# Patient Record
Sex: Female | Born: 1960 | Race: White | Hispanic: No | Marital: Married | Smoking: Current every day smoker
Health system: Southern US, Community
[De-identification: ages and names within clinical notes are randomized; demographics above are authoritative.]

## PROBLEM LIST (undated history)

## (undated) DIAGNOSIS — E042 Nontoxic multinodular goiter: Secondary | ICD-10-CM

## (undated) DIAGNOSIS — M8580 Other specified disorders of bone density and structure, unspecified site: Secondary | ICD-10-CM

## (undated) DIAGNOSIS — K5792 Diverticulitis of intestine, part unspecified, without perforation or abscess without bleeding: Secondary | ICD-10-CM

## (undated) DIAGNOSIS — Z8051 Family history of malignant neoplasm of kidney: Secondary | ICD-10-CM

## (undated) DIAGNOSIS — C50919 Malignant neoplasm of unspecified site of unspecified female breast: Secondary | ICD-10-CM

## (undated) DIAGNOSIS — Z803 Family history of malignant neoplasm of breast: Secondary | ICD-10-CM

## (undated) DIAGNOSIS — Z8042 Family history of malignant neoplasm of prostate: Secondary | ICD-10-CM

## (undated) DIAGNOSIS — Z800 Family history of malignant neoplasm of digestive organs: Secondary | ICD-10-CM

## (undated) DIAGNOSIS — T7840XA Allergy, unspecified, initial encounter: Secondary | ICD-10-CM

## (undated) DIAGNOSIS — Z8489 Family history of other specified conditions: Secondary | ICD-10-CM

## (undated) HISTORY — PX: BREAST SURGERY: SHX581

## (undated) HISTORY — PX: OTHER SURGICAL HISTORY: SHX169

## (undated) HISTORY — DX: Allergy, unspecified, initial encounter: T78.40XA

## (undated) HISTORY — PX: MASTECTOMY: SHX3

## (undated) HISTORY — PX: DILATION AND CURETTAGE OF UTERUS: SHX78

## (undated) HISTORY — DX: Diverticulitis of intestine, part unspecified, without perforation or abscess without bleeding: K57.92

## (undated) HISTORY — DX: Nontoxic multinodular goiter: E04.2

## (undated) HISTORY — DX: Family history of malignant neoplasm of kidney: Z80.51

## (undated) HISTORY — DX: Family history of malignant neoplasm of prostate: Z80.42

## (undated) HISTORY — DX: Family history of malignant neoplasm of digestive organs: Z80.0

## (undated) HISTORY — PX: ADENOIDECTOMY: SUR15

## (undated) HISTORY — PX: COLONOSCOPY: SHX174

## (undated) HISTORY — PX: BREAST BIOPSY: SHX20

## (undated) HISTORY — DX: Other specified disorders of bone density and structure, unspecified site: M85.80

## (undated) HISTORY — DX: Family history of malignant neoplasm of breast: Z80.3

---

## 1998-07-18 ENCOUNTER — Other Ambulatory Visit: Admission: RE | Admit: 1998-07-18 | Discharge: 1998-07-18 | Payer: Self-pay | Admitting: *Deleted

## 1998-10-13 ENCOUNTER — Encounter: Payer: Self-pay | Admitting: *Deleted

## 1998-10-13 ENCOUNTER — Ambulatory Visit (HOSPITAL_COMMUNITY): Admission: RE | Admit: 1998-10-13 | Discharge: 1998-10-13 | Payer: Self-pay | Admitting: *Deleted

## 1999-10-16 ENCOUNTER — Encounter: Payer: Self-pay | Admitting: *Deleted

## 1999-10-16 ENCOUNTER — Ambulatory Visit (HOSPITAL_COMMUNITY): Admission: RE | Admit: 1999-10-16 | Discharge: 1999-10-16 | Payer: Self-pay | Admitting: *Deleted

## 1999-11-21 ENCOUNTER — Other Ambulatory Visit: Admission: RE | Admit: 1999-11-21 | Discharge: 1999-11-21 | Payer: Self-pay | Admitting: *Deleted

## 2000-10-17 ENCOUNTER — Encounter: Payer: Self-pay | Admitting: *Deleted

## 2000-10-17 ENCOUNTER — Ambulatory Visit (HOSPITAL_COMMUNITY): Admission: RE | Admit: 2000-10-17 | Discharge: 2000-10-17 | Payer: Self-pay | Admitting: *Deleted

## 2001-01-23 ENCOUNTER — Other Ambulatory Visit: Admission: RE | Admit: 2001-01-23 | Discharge: 2001-01-23 | Payer: Self-pay | Admitting: *Deleted

## 2001-10-31 ENCOUNTER — Ambulatory Visit (HOSPITAL_COMMUNITY): Admission: RE | Admit: 2001-10-31 | Discharge: 2001-10-31 | Payer: Self-pay | Admitting: *Deleted

## 2001-10-31 ENCOUNTER — Encounter: Payer: Self-pay | Admitting: *Deleted

## 2002-02-11 ENCOUNTER — Other Ambulatory Visit: Admission: RE | Admit: 2002-02-11 | Discharge: 2002-02-11 | Payer: Self-pay | Admitting: *Deleted

## 2007-04-14 ENCOUNTER — Other Ambulatory Visit: Admission: RE | Admit: 2007-04-14 | Discharge: 2007-04-14 | Payer: Self-pay | Admitting: *Deleted

## 2007-05-16 ENCOUNTER — Ambulatory Visit: Payer: Self-pay | Admitting: Gastroenterology

## 2007-05-16 LAB — CONVERTED CEMR LAB
Basophils Absolute: 0.1 10*3/uL (ref 0.0–0.1)
Basophils Relative: 1 % (ref 0.0–1.0)
Eosinophils Absolute: 0.5 10*3/uL (ref 0.0–0.6)
Eosinophils Relative: 4.7 % (ref 0.0–5.0)
HCT: 41.1 % (ref 36.0–46.0)
Hemoglobin: 14.4 g/dL (ref 12.0–15.0)
Lymphocytes Relative: 27.6 % (ref 12.0–46.0)
MCHC: 35.1 g/dL (ref 30.0–36.0)
MCV: 93 fL (ref 78.0–100.0)
Monocytes Absolute: 0.9 10*3/uL — ABNORMAL HIGH (ref 0.2–0.7)
Monocytes Relative: 9.2 % (ref 3.0–11.0)
Neutro Abs: 5.9 10*3/uL (ref 1.4–7.7)
Neutrophils Relative %: 57.5 % (ref 43.0–77.0)
Platelets: 342 10*3/uL (ref 150–400)
RBC: 4.43 M/uL (ref 3.87–5.11)
RDW: 13.5 % (ref 11.5–14.6)
TSH: 0.4 microintl units/mL (ref 0.35–5.50)
WBC: 10.2 10*3/uL (ref 4.5–10.5)

## 2007-06-23 ENCOUNTER — Ambulatory Visit: Payer: Self-pay | Admitting: Gastroenterology

## 2007-12-15 DIAGNOSIS — E785 Hyperlipidemia, unspecified: Secondary | ICD-10-CM | POA: Insufficient documentation

## 2007-12-15 DIAGNOSIS — Z8719 Personal history of other diseases of the digestive system: Secondary | ICD-10-CM | POA: Insufficient documentation

## 2007-12-15 DIAGNOSIS — N841 Polyp of cervix uteri: Secondary | ICD-10-CM | POA: Insufficient documentation

## 2008-04-29 ENCOUNTER — Ambulatory Visit (HOSPITAL_COMMUNITY): Admission: RE | Admit: 2008-04-29 | Discharge: 2008-04-29 | Payer: Self-pay | Admitting: *Deleted

## 2008-05-17 ENCOUNTER — Other Ambulatory Visit: Admission: RE | Admit: 2008-05-17 | Discharge: 2008-05-17 | Payer: Self-pay | Admitting: Gynecology

## 2009-06-01 ENCOUNTER — Ambulatory Visit (HOSPITAL_COMMUNITY): Admission: RE | Admit: 2009-06-01 | Discharge: 2009-06-01 | Payer: Self-pay | Admitting: Gynecology

## 2011-01-30 NOTE — Assessment & Plan Note (Signed)
[facility]                         GASTROENTEROLOGY OFFICE NOTE   Victoria Richardson, Victoria Richardson                      MRN:          [id number]  DATE:05/16/2007                            DOB:          09-22-1960    NEW PATIENT CONSULTATION:   REASON FOR REFERRAL:  Dr. Randell Patient asked me to evaluate Victoria Richardson in  consultation regarding constipation and intermittent rectal bleeding.   HISTORY OF PRESENT ILLNESS:  Victoria Richardson is a very pleasant 50 year old  woman who has had a change in her bowel habits over the past six months  to a year or so.  She said previously she was moving her bowels once  daily, very easily, without having to strain.  She had no rectal  bleeding.  Things changed six months to a year ago, and over time she is  having to strain at times to move her bowels.  She is also seeing minor  intermittent bright red blood per rectum at the time of bowel movements.  She will sometimes take laxatives she would only move her bowels once  every other day, at most.  She has no abdominal pain.   REVIEW OF SYSTEMS:  Normal for a 10 pound weight gain in the past year.  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   PAST MEDICAL HISTORY:  Elevated cholesterol. Tubal ligation 23 years  ago.  Recent D&C last week for polyps found on her cervix.   CURRENT MEDICATIONS:  None.   ALLERGIES:  No known drug allergies.   SOCIAL HISTORY:  Married.  Two children.  Works as a [employer] fast streamer.  Quit smoking eight months ago.  Nondrinker.   FAMILY HISTORY:  Husband diagnosed with colon cancer.  Mother had colon  polyps.   PHYSICAL EXAMINATION:  Height:  5 feet 4 inches, 135 pounds.  Blood  pressure 108/72, pulse 60.  CONSTITUTIONAL:  Generally well-appearing.  NEUROLOGIC:  Alert and oriented x3.  HEENT:  Eyes:  Extraocular movements intact.  Mouth:  Oropharynx moist.  No lesions.  NECK:  Supple.  No lymphadenopathy.  LUNGS:   Clear to auscultation bilaterally.  CARDIOVASCULAR:  Heart has a regular rate and rhythm.  ABDOMEN:  Soft, nontender, nondistended.  Normal bowel sounds.  EXTREMITIES:  No lower extremity edema.  SKIN:  No rash or lesions on visible extremities.   ASSESSMENT/PLAN:  A 50 year old woman with change in her bowel habits  recently, relatively constipated with intermittent bright red blood per  rectum.   She does not appear to be anemic clinically, but I will have her get a  CBC just to be sure.  She will also get thyroid testing, as hypothyroid  can contribute to constipation.  She has not had a colonoscopy for colon  cancer screening and has a first-degree relative with colon polyps, plus  she is having some rectal bleeding, so these are both definite  indications for colonoscopy.  We will arrange for this to be done at her  soonest convenience.  Lastly, I have recommended her to try fiber  supplements with Citrucel.  She will slowly titrate upward.  That is  usually very effecting in controlling minor constipation such as hers.     Rachael Fee, MD  Electronically Signed    DPJ/MedQ  DD: 05/16/2007  DT: 05/17/2007  Job #: [id number]   cc:   Almedia Balls. Randell Patient, M.D.

## 2013-05-06 ENCOUNTER — Other Ambulatory Visit (HOSPITAL_COMMUNITY): Payer: Self-pay | Admitting: Nurse Practitioner

## 2013-05-06 DIAGNOSIS — Z1231 Encounter for screening mammogram for malignant neoplasm of breast: Secondary | ICD-10-CM

## 2013-06-12 ENCOUNTER — Ambulatory Visit (HOSPITAL_COMMUNITY)
Admission: RE | Admit: 2013-06-12 | Discharge: 2013-06-12 | Disposition: A | Discharge status: Home / Self Care | Payer: BC Managed Care – PPO | Source: Ambulatory Visit | Attending: Nurse Practitioner | Admitting: Nurse Practitioner

## 2013-06-12 DIAGNOSIS — Z1231 Encounter for screening mammogram for malignant neoplasm of breast: Secondary | ICD-10-CM | POA: Insufficient documentation

## 2014-02-10 ENCOUNTER — Ambulatory Visit (INDEPENDENT_AMBULATORY_CARE_PROVIDER_SITE_OTHER): Payer: BC Managed Care – PPO | Admitting: Podiatry

## 2014-02-10 ENCOUNTER — Encounter: Payer: Self-pay | Admitting: Podiatry

## 2014-02-10 ENCOUNTER — Ambulatory Visit (INDEPENDENT_AMBULATORY_CARE_PROVIDER_SITE_OTHER): Payer: BC Managed Care – PPO

## 2014-02-10 VITALS — BP 143/89 | HR 70 | Resp 16

## 2014-02-10 DIAGNOSIS — M779 Enthesopathy, unspecified: Secondary | ICD-10-CM

## 2014-02-10 DIAGNOSIS — L84 Corns and callosities: Secondary | ICD-10-CM

## 2014-02-10 DIAGNOSIS — Q828 Other specified congenital malformations of skin: Secondary | ICD-10-CM

## 2014-02-10 MED ORDER — TRIAMCINOLONE ACETONIDE 10 MG/ML IJ SUSP
10.0000 mg | Freq: Once | INTRAMUSCULAR | Status: AC
Start: 2014-02-10 — End: 2014-02-10
  Administered 2014-02-10: 10 mg

## 2014-02-10 NOTE — Progress Notes (Signed)
Subjective:     Patient ID: Victoria Richardson, female   DOB: 12-Jul-1961, 53 y.o.   MRN: [id number]  HPI patient states I have these awful lesions underneath my foot with my right one being much worse than the left and making it hard for me to walk. States it's been going on for almost a year   Review of Systems  All other systems reviewed and are negative.      Objective:   Physical Exam  Nursing note and vitals reviewed. Constitutional: She is oriented to person, place, and time.  Cardiovascular: Intact distal pulses.   Musculoskeletal: Normal range of motion.  Neurological: She is oriented to person, place, and time.  Skin: Skin is warm.   neurovascular status intact with range of motion adequate and muscle strength within normal limits. 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 Reappoint when symptomatic began and consider orthotics or possible osteotomy

## 2014-02-10 NOTE — Progress Notes (Signed)
   Subjective:    Patient ID: Victoria Richardson, female    DOB: 10/07/1960, 53 y.o.   MRN: [id number]  HPI Comments: "I have this wart or something"  Patient c/o tender callused area sub 2nd and 5th MPJ left and sub 5th MPJ right for several months. The right foot is worse. She has been trimming these areas-no help. Walking a lot makes worse.     Review of Systems  All other systems reviewed and are negative.      Objective:   Physical Exam        Assessment & Plan:

## 2014-05-05 ENCOUNTER — Ambulatory Visit: Payer: BC Managed Care – PPO | Admitting: Podiatry

## 2014-12-06 ENCOUNTER — Other Ambulatory Visit (HOSPITAL_COMMUNITY): Payer: Self-pay | Admitting: Nurse Practitioner

## 2014-12-06 DIAGNOSIS — Z1231 Encounter for screening mammogram for malignant neoplasm of breast: Secondary | ICD-10-CM

## 2014-12-28 ENCOUNTER — Ambulatory Visit (HOSPITAL_COMMUNITY)
Admission: RE | Admit: 2014-12-28 | Discharge: 2014-12-28 | Disposition: A | Discharge status: Home / Self Care | Payer: BLUE CROSS/BLUE SHIELD | Source: Ambulatory Visit | Attending: Nurse Practitioner | Admitting: Nurse Practitioner

## 2014-12-28 DIAGNOSIS — Z1231 Encounter for screening mammogram for malignant neoplasm of breast: Secondary | ICD-10-CM | POA: Insufficient documentation

## 2014-12-30 ENCOUNTER — Other Ambulatory Visit: Payer: Self-pay | Admitting: Nurse Practitioner

## 2014-12-30 DIAGNOSIS — R928 Other abnormal and inconclusive findings on diagnostic imaging of breast: Secondary | ICD-10-CM

## 2015-01-03 ENCOUNTER — Other Ambulatory Visit: Payer: Self-pay | Admitting: Nurse Practitioner

## 2015-01-03 DIAGNOSIS — R928 Other abnormal and inconclusive findings on diagnostic imaging of breast: Secondary | ICD-10-CM

## 2015-01-04 ENCOUNTER — Ambulatory Visit
Admission: RE | Admit: 2015-01-04 | Discharge: 2015-01-04 | Disposition: A | Discharge status: Home / Self Care | Payer: BLUE CROSS/BLUE SHIELD | Source: Ambulatory Visit | Attending: Nurse Practitioner | Admitting: Nurse Practitioner

## 2015-01-04 DIAGNOSIS — R928 Other abnormal and inconclusive findings on diagnostic imaging of breast: Secondary | ICD-10-CM

## 2016-01-30 ENCOUNTER — Encounter: Payer: Self-pay | Admitting: Physician Assistant

## 2016-01-30 ENCOUNTER — Ambulatory Visit (INDEPENDENT_AMBULATORY_CARE_PROVIDER_SITE_OTHER): Payer: BLUE CROSS/BLUE SHIELD | Admitting: Physician Assistant

## 2016-01-30 VITALS — BP 118/74 | HR 60 | Temp 98.2°F | Resp 18 | Ht 63.0 in | Wt 127.0 lb

## 2016-01-30 DIAGNOSIS — Z23 Encounter for immunization: Secondary | ICD-10-CM | POA: Diagnosis not present

## 2016-01-30 DIAGNOSIS — Z720 Tobacco use: Secondary | ICD-10-CM | POA: Diagnosis not present

## 2016-01-30 DIAGNOSIS — Z0000 Encounter for general adult medical examination without abnormal findings: Secondary | ICD-10-CM | POA: Diagnosis not present

## 2016-01-30 DIAGNOSIS — Z716 Tobacco abuse counseling: Secondary | ICD-10-CM | POA: Diagnosis not present

## 2016-01-30 DIAGNOSIS — F17200 Nicotine dependence, unspecified, uncomplicated: Secondary | ICD-10-CM

## 2016-01-30 LAB — HM PAP SMEAR: HM PAP: NEGATIVE

## 2016-01-30 MED ORDER — VARENICLINE TARTRATE 1 MG PO TABS: 1.0000 mg | ORAL_TABLET | Freq: Two times a day (BID) | ORAL | Status: DC

## 2016-01-30 MED ORDER — VARENICLINE TARTRATE 0.5 MG PO TABS: 0.5000 mg | ORAL_TABLET | Freq: Two times a day (BID) | ORAL | Status: DC

## 2016-01-30 NOTE — Progress Notes (Signed)
Patient ID: Victoria Richardson MRN: [id number], DOB: [birth year], 58 y.o. Date of Encounter: 01/30/2016,   Chief Complaint: Physical (CPE)  HPI: 55 y.o. y/o white female  here for CPE.   She is being seen as a new patient to [facility] today and also for CPE.  Says she "hasn't gone to the doctor in years".  She has no specific complaints/concerns today.   Review of Systems: Consitutional: No fever, chills, fatigue, night sweats, lymphadenopathy. No significant/unexplained weight changes. Eyes: No visual changes, eye redness, or discharge. ENT/Mouth: No ear pain, sore throat, nasal drainage, or sinus pain. Cardiovascular: No chest pressure,heaviness, tightness or squeezing, even with exertion. No increased shortness of breath or dyspnea on exertion.No palpitations, edema, orthopnea, PND. Respiratory: No cough, hemoptysis, SOB, or wheezing. Gastrointestinal: No anorexia, dysphagia, reflux, pain, nausea, vomiting, hematemesis, diarrhea, constipation, BRBPR, or melena. Breast: No mass, nodules, bulging, or retraction. No skin changes or inflammation. No nipple discharge. No lymphadenopathy. Genitourinary: No dysuria, hematuria, incontinence, vaginal discharge, pruritis, burning, abnormal bleeding, or pain. Musculoskeletal: No decreased ROM, No joint pain or swelling. No significant pain in neck, back, or extremities. Skin: No rash, pruritis, or concerning lesions. Neurological: No headache, dizziness, syncope, seizures, tremors, memory loss, coordination problems, or paresthesias. Psychological: No anxiety, depression, hallucinations, SI/HI. Endocrine: No polydipsia, polyphagia, polyuria, or known diabetes.No increased fatigue. No palpitations/rapid heart rate. No significant/unexplained weight change. All other systems were reviewed and are otherwise negative.  History reviewed. No pertinent past medical history.   History reviewed. No pertinent past surgical history.  Home Meds:    Outpatient Prescriptions Prior to Visit  Medication Sig Dispense Refill  . Ascorbic Acid (VITAMIN C PO) Take by mouth.    . Multiple Vitamin (MULTIVITAMIN) capsule Take 1 capsule by mouth daily.     No facility-administered medications prior to visit.    Allergies: No Known Allergies  Social History   Social History  . Marital Status: Married    Spouse Name: N/A  . Number of Children: N/A  . Years of Education: N/A   Occupational History  . Not on file.   Social History Main Topics  . Smoking status: Current Every Day Smoker -- 0.50 packs/day    Types: Cigarettes  . Smokeless tobacco: Never Used  . Alcohol Use: No  . Drug Use: No  . Sexual Activity: Not Currently   Other Topics Concern  . 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   Married.    Smokes---    Family History  Problem Relation Age of Onset  . Cancer Mother     Breast Cancer  . Heart disease Father 12    CAD    Physical Exam: Blood pressure 118/74, pulse 60, temperature 98.2 F (36.8 C), temperature source Oral, resp. rate 18, height 5\' 3"  (1.6 m), weight 127 lb (57.607 kg)., Body mass index is 22.5 kg/(m^2). General: Well developed, well nourished,WF. Appears in no acute distress. HEENT: Normocephalic, atraumatic. Conjunctiva pink, sclera non-icteric. Pupils 2 mm constricting to 1 mm, round, regular, and equally reactive to light and accomodation. EOMI. Internal auditory canal clear. TMs with good cone of light and without pathology. Nasal mucosa pink. Nares are without discharge. No sinus tenderness. Oral mucosa pink.  Pharynx without exudate.   Neck: Supple. Trachea midline. No thyromegaly. Full ROM. No lymphadenopathy.No Carotid Bruits. Lungs: Clear to auscultation bilaterally without wheezes, rales, or rhonchi. Breathing is of normal  effort and unlabored. Cardiovascular: RRR with S1 S2. No murmurs, rubs, or gallops. Distal  pulses 2+ symmetrically. No carotid or abdominal bruits. Breast: Symmetrical. No masses. Nipples without discharge. Abdomen: Soft, non-tender, non-distended with normoactive bowel sounds. No hepatosplenomegaly or masses. No rebound/guarding. No CVA tenderness. No hernias.  Genitourinary:  External genitalia without lesions. Vaginal mucosa pink.No discharge present. Cervix pink and without discharge. No cervical tenderness.Normal uterus size. No adnexal mass or tenderness.  Pap smear taken. Musculoskeletal: Full range of motion and 5/5 strength throughout. Without swelling. Skin: Warm and moist without erythema, ecchymosis, wounds, or rash. Neuro: A+Ox3. CN II-XII grossly intact. Moves all extremities spontaneously. Full sensation throughout. Normal gait. DTR 2+ throughout upper and lower extremities.  Psych:  Responds to questions appropriately with a normal affect.   Assessment/Plan:  55 y.o. y/o white female here for CPE  1. Visit for preventive health examination  A. Screening Labs: - CBC with Differential/Platelet; Future - COMPLETE METABOLIC PANEL WITH GFR; Future - Lipid panel; Future - TSH; Future - VITAMIN D 25 Hydroxy (Vit-D Deficiency, Fractures); Future  B. Pap: - PAP, Thin Prep w/HPV rflx HPV Type 16/18  C. Screening Mammogram: She has been having mammograms. Her Mother has h/o Breast Cancer.  Last Mammogram 12/29/2014. She says she has received letter informing her she is due to schedule f/u and she says she will f/u with scheduling this.   D. DEXA/BMD:  Can wait to do this closer to age 81  E. Colorectal Cancer Screening: She says she had one colonoscopy in past --says it was done sec to some bleeding---says she was told to increase fiber in diet , told that colonoscopy normal and could wait 10 years to repeat I found colonoscopy report in Epic---Performed 06/23/2007---Dr. Jacobs--[facility]-- Repeat 10 years.   F. Immunizations:   Influenza:------------N/A Tetanus:---------   > 10 years---Agreeable to UpDate today---Given here 01/30/2016 Pneumococcal: Given that she is a smoker, should have Pneumovax 23. Pt agreeable to receiv today---Pneumovax 23--Given here 01/30/2016                           Prevnar 13---Will give at age 36 Zostavax:  ------- Will discuss at age 81   2. Smoker She says she started smoking as a teenager. Says she currently smokes ~ 1ppd.  Says she has quit, then starts back. Does want to quit. Has used Patches in past. Has never had Rx for Chantix. Is agreeable to try Chantix and try to quit.   3. Encounter for smoking cessation counseling She says she started smoking as a teenager. Says she currently smokes ~ 1ppd.  Says she has quit, then starts back. Does want to quit. Has used Patches in past. Has never had Rx for Chantix. Is agreeable to try Chantix and try to quit.  Today I gave and reviewed handout--Tips for Smoking Cessation Discussed possible adv effects with Chantix. Stop med and call us if anyu change in mood/behavior.  Sent Rx for Starting Dose Pack and Continuing Dose Pack.   She is going to return Wednesday morning fasting for labs.  Will f/u with her once I get those results.     [address] [place], [place], [facility] 01/30/2016 3:35 PM

## 2016-02-01 ENCOUNTER — Other Ambulatory Visit: Payer: BLUE CROSS/BLUE SHIELD

## 2016-02-01 ENCOUNTER — Other Ambulatory Visit: Payer: Self-pay | Admitting: Physician Assistant

## 2016-02-01 DIAGNOSIS — Z0000 Encounter for general adult medical examination without abnormal findings: Secondary | ICD-10-CM

## 2016-02-01 LAB — COMPLETE METABOLIC PANEL WITH GFR
ALT: 15 U/L (ref 6–29)
AST: 15 U/L (ref 10–35)
Albumin: 4.3 g/dL (ref 3.6–5.1)
Alkaline Phosphatase: 60 U/L (ref 33–130)
BILIRUBIN TOTAL: 0.6 mg/dL (ref 0.2–1.2)
BUN: 11 mg/dL (ref 7–25)
CO2: 27 mmol/L (ref 20–31)
Calcium: 9.3 mg/dL (ref 8.6–10.4)
Chloride: 109 mmol/L (ref 98–110)
Creat: 0.58 mg/dL (ref 0.50–1.05)
GFR, Est African American: >89 mL/min (ref >=60)
GLUCOSE: 97 mg/dL (ref 70–99)
Potassium: 4.4 mmol/L (ref 3.5–5.3)
SODIUM: 144 mmol/L (ref 135–146)
Total Protein: 6.7 g/dL (ref 6.1–8.1)

## 2016-02-01 LAB — CBC WITH DIFFERENTIAL/PLATELET
BASOS ABS: 73 {cells}/uL (ref 0–200)
Basophils Relative: 1 %
EOS ABS: 511 {cells}/uL — AB (ref 15–500)
Eosinophils Relative: 7 %
HEMATOCRIT: 42.1 % (ref 35.0–45.0)
Hemoglobin: 14.3 g/dL (ref 12.0–15.0)
LYMPHS PCT: 31 %
Lymphs Abs: 2263 cells/uL (ref 850–3900)
MCH: 32.1 pg (ref 27.0–33.0)
MCHC: 34 g/dL (ref 32.0–36.0)
MCV: 94.4 fL (ref 80.0–100.0)
MONO ABS: 511 {cells}/uL (ref 200–950)
MPV: 9.8 fL (ref 7.5–12.5)
Monocytes Relative: 7 %
NEUTROS PCT: 54 %
Neutro Abs: 3942 cells/uL (ref 1500–7800)
PLATELETS: 311 10*3/uL (ref 140–400)
RBC: 4.46 MIL/uL (ref 3.80–5.10)
RDW: 13.2 % (ref 11.0–15.0)
WBC: 7.3 10*3/uL (ref 3.8–10.8)

## 2016-02-01 LAB — PAP, THIN PREP W/HPV RFLX HPV TYPE 16/18: HPV DNA High Risk: NOT DETECTED

## 2016-02-01 LAB — LIPID PANEL
Cholesterol: 177 mg/dL (ref 125–200)
HDL: 53 mg/dL (ref >=46)
LDL CALC: 103 mg/dL (ref <130)
Total CHOL/HDL Ratio: 3.3 Ratio (ref <=5.0)
Triglycerides: 103 mg/dL (ref <150)
VLDL: 21 mg/dL (ref <30)

## 2016-02-01 LAB — TSH: TSH: 0.28 m[IU]/L — AB

## 2016-02-02 ENCOUNTER — Encounter: Payer: Self-pay | Admitting: Family Medicine

## 2016-02-02 LAB — VITAMIN D 25 HYDROXY (VIT D DEFICIENCY, FRACTURES): Vit D, 25-Hydroxy: 29 ng/mL — ABNORMAL LOW (ref 30–100)

## 2016-02-04 LAB — T4, FREE: Free T4: 1.1 ng/dL (ref 0.8–1.8)

## 2016-02-07 ENCOUNTER — Other Ambulatory Visit: Payer: Self-pay | Admitting: Family Medicine

## 2016-02-07 DIAGNOSIS — E039 Hypothyroidism, unspecified: Secondary | ICD-10-CM

## 2016-06-12 DIAGNOSIS — Z23 Encounter for immunization: Secondary | ICD-10-CM | POA: Diagnosis not present

## 2017-07-22 ENCOUNTER — Encounter: Payer: Self-pay | Admitting: Gastroenterology

## 2017-07-25 ENCOUNTER — Encounter: Payer: Self-pay | Admitting: Physician Assistant

## 2017-07-25 ENCOUNTER — Ambulatory Visit: Payer: BLUE CROSS/BLUE SHIELD | Admitting: Physician Assistant

## 2017-07-25 ENCOUNTER — Other Ambulatory Visit: Payer: Self-pay

## 2017-07-25 VITALS — BP 136/84 | HR 83 | Temp 98.1°F | Resp 16 | Wt 121.8 lb

## 2017-07-25 DIAGNOSIS — J029 Acute pharyngitis, unspecified: Secondary | ICD-10-CM | POA: Diagnosis not present

## 2017-07-25 DIAGNOSIS — J040 Acute laryngitis: Secondary | ICD-10-CM

## 2017-07-25 MED ORDER — AMOXICILLIN-POT CLAVULANATE 875-125 MG PO TABS: 1.0000 | ORAL_TABLET | Freq: Two times a day (BID) | ORAL | 0 refills | Status: AC

## 2017-07-25 NOTE — Progress Notes (Signed)
Patient ID: [name] MRN: [id number], DOB: 1961-01-14, 56 y.o. Date of Encounter: 07/25/2017, 2:23 PM    Chief Complaint:  Chief Complaint  Patient presents with  . Sore Throat    x2weeks     HPI: 56 y.o. year old female presents with above.   States that she has been having hoarseness.  "Lost her voice for about 2 weeks."Says that the sore throat just started the last couple of days.  Says that she feels a little bit of congestion feeling behind her ears but otherwise has not had much other symptoms.  Has not been having much nasal congestion or mucus from the nose.  It does not feel like she has chest congestion or cough.  Has had no fevers or chills.     Home Meds:   Outpatient Medications Prior to Visit  Medication Sig Dispense Refill  . Ascorbic Acid (VITAMIN C PO) Take by mouth.    . Melatonin 5 MG TABS Take 1 tablet by mouth at bedtime as needed.    . Multiple Vitamin (MULTIVITAMIN) capsule Take 1 capsule by mouth daily.    . varenicline (CHANTIX CONTINUING MONTH PAK) 1 MG tablet Take 1 tablet (1 mg total) by mouth 2 (two) times daily. 60 tablet 3  . varenicline (CHANTIX) 0.5 MG tablet Take 1 tablet (0.5 mg total) by mouth 2 (two) times daily. 30 tablet 0   No facility-administered medications prior to visit.     Allergies: No Known Allergies    Review of Systems: See HPI for pertinent ROS. All other ROS negative.    Physical Exam: Blood pressure 136/84, pulse 83, temperature 98.1 F (36.7 C), temperature source Oral, resp. rate 16, weight 55.2 kg (121 lb 12.8 oz), SpO2 98 %., Body mass index is 21.58 kg/m. General:  WNWD WF. Appears in no acute distress. HEENT: Normocephalic, atraumatic, eyes without discharge, sclera non-icteric, nares are without discharge. Bilateral auditory canals clear, TM's are without perforation, pearly grey and translucent with reflective cone of light bilaterally. Uvula and posterior pharynx with moderate erythema. No exudate. No  peritonsillar abscess. Neck: Supple. No thyromegaly. No lymphadenopathy. Lungs: Clear bilaterally to auscultation without wheezes, rales, or rhonchi. Breathing is unlabored. Heart: Regular rhythm. No murmurs, rubs, or gallops. Msk:  Strength and tone normal for age. Extremities/Skin: Warm and dry.  Neuro: Alert and oriented X 3. Moves all extremities spontaneously. Gait is normal. CNII-XII grossly in tact. Psych:  Responds to questions appropriately with a normal affect.     ASSESSMENT AND PLAN:  56 y.o. year old female with  1. Laryngitis Rapid strep test is negative.  However symptoms have been going on for 2 weeks and persisting so at this point seems bacterial requiring antibiotics.  Told her to take the antibiotics as directed and complete all 7 days.  If symptoms do not resolve after completion of antibiotics, then definitely follow-up with me.  Would need ENT eval if voice does not return. - amoxicillin-clavulanate (AUGMENTIN) 875-125 MG tablet; Take 1 tablet every 12 (twelve) hours for 7 days by mouth.  Dispense: 14 tablet; Refill: 0  2. Acute pharyngitis, unspecified etiology - amoxicillin-clavulanate (AUGMENTIN) 875-125 MG tablet; Take 1 tablet every 12 (twelve) hours for 7 days by mouth.  Dispense: 14 tablet; Refill: 0  3. Sore throat - STREP GROUP A AG, W/REFLEX TO CULT - amoxicillin-clavulanate (AUGMENTIN) 875-125 MG tablet; Take 1 tablet every 12 (twelve) hours for 7 days by mouth.  Dispense: 14 tablet; Refill: 0  Marin Olp [place], [place], [facility] 07/25/2017 2:23 PM

## 2017-07-27 LAB — CULTURE, GROUP A STREP
MICRO NUMBER:: 81258397
SPECIMEN QUALITY:: ADEQUATE

## 2017-07-27 LAB — STREP GROUP A AG, W/REFLEX TO CULT: STREPTOCOCCUS, GROUP A SCREEN (DIRECT): NOT DETECTED

## 2017-09-26 ENCOUNTER — Encounter: Payer: Self-pay | Admitting: Gastroenterology

## 2017-10-28 ENCOUNTER — Encounter: Payer: Self-pay | Admitting: Physician Assistant

## 2017-11-11 ENCOUNTER — Ambulatory Visit (AMBULATORY_SURGERY_CENTER): Payer: Self-pay | Admitting: *Deleted

## 2017-11-11 ENCOUNTER — Other Ambulatory Visit: Payer: Self-pay

## 2017-11-11 VITALS — Ht 64.0 in | Wt 118.4 lb

## 2017-11-11 DIAGNOSIS — Z8371 Family history of colonic polyps: Secondary | ICD-10-CM

## 2017-11-11 MED ORDER — PEG 3350-KCL-NA BICARB-NACL 420 G PO SOLR: 4000.0000 mL | Freq: Once | ORAL | 0 refills | Status: AC

## 2017-11-11 NOTE — Progress Notes (Signed)
No egg or soy allergy known to patient  No issues with past sedation with any surgeries  or procedures, no intubation problems  No diet pills per patient No home 02 use per patient  No blood thinners per patient  Pt denies issues with constipation  No A fib or A flutter  EMMI video sent to pt's e mail  Pt. declined 

## 2017-11-25 ENCOUNTER — Other Ambulatory Visit: Payer: Self-pay

## 2017-11-25 ENCOUNTER — Ambulatory Visit (AMBULATORY_SURGERY_CENTER): Payer: BLUE CROSS/BLUE SHIELD | Admitting: Gastroenterology

## 2017-11-25 ENCOUNTER — Encounter: Payer: Self-pay | Admitting: Gastroenterology

## 2017-11-25 VITALS — BP 139/79 | HR 66 | Temp 98.2°F | Resp 19 | Ht 64.0 in | Wt 118.0 lb

## 2017-11-25 DIAGNOSIS — Z1211 Encounter for screening for malignant neoplasm of colon: Secondary | ICD-10-CM | POA: Diagnosis not present

## 2017-11-25 DIAGNOSIS — Z8371 Family history of colonic polyps: Secondary | ICD-10-CM | POA: Diagnosis not present

## 2017-11-25 MED ORDER — SODIUM CHLORIDE 0.9 % IV SOLN: 500.0000 mL | Freq: Once | INTRAVENOUS | Status: DC

## 2017-11-25 NOTE — Op Note (Signed)
[facility] Patient Name: Victoria Richardson Procedure Date: 11/25/2017 8:37 AM MRN: [id number] Endoscopist: Milus Banister , MD Age: 57 Referring MD:  Date of Birth: September 21, 1960 Gender: Female Account #: [id number] Procedure:                Colonoscopy Indications:              Screening for colorectal malignant neoplasm Medicines:                Monitored Anesthesia Care Procedure:                Pre-Anesthesia Assessment:                           - Prior to the procedure, a History and Physical                            was performed, and patient medications and                            allergies were reviewed. The patient's tolerance of                            previous anesthesia was also reviewed. The risks                            and benefits of the procedure and the sedation                            options and risks were discussed with the patient.                            All questions were answered, and informed consent                            was obtained. Prior Anticoagulants: The patient has                            taken no previous anticoagulant or antiplatelet                            agents. ASA Grade Assessment: II - A patient with                            mild systemic disease. After reviewing the risks                            and benefits, the patient was deemed in                            satisfactory condition to undergo the procedure.                           After obtaining informed consent, the colonoscope  was passed under direct vision. Throughout the                            procedure, the patient's blood pressure, pulse, and                            oxygen saturations were monitored continuously. The                            Colonoscope was introduced through the anus and                            advanced to the the cecum, identified by                            appendiceal orifice and  ileocecal valve. The                            colonoscopy was performed without difficulty. The                            patient tolerated the procedure well. The quality                            of the bowel preparation was good. The ileocecal                            valve, appendiceal orifice, and rectum were                            photographed. Scope In: 8:40:29 AM Scope Out: 8:52:43 AM Scope Withdrawal Time: 0 hours 7 minutes 59 seconds  Total Procedure Duration: 0 hours 12 minutes 14 seconds  Findings:                 Multiple small and large-mouthed diverticula were                            found in the left colon.                           The exam was otherwise without abnormality on                            direct and retroflexion views. Complications:            No immediate complications. Estimated blood loss:                            None. Estimated Blood Loss:     Estimated blood loss: none. Impression:               - Diverticulosis in the left colon.                           - The examination was otherwise normal on direct  and retroflexion views.                           - No polyps or cancers. Recommendation:           - Patient has a contact number available for                            emergencies. The signs and symptoms of potential                            delayed complications were discussed with the                            patient. Return to normal activities tomorrow.                            Written discharge instructions were provided to the                            patient.                           - Resume previous diet.                           - Continue present medications.                           - Repeat colonoscopy in 10 years for screening. Milus Banister, MD 11/25/2017 8:54:55 AM This report has been signed electronically.

## 2017-11-25 NOTE — Patient Instructions (Signed)
Discharge instructions given. Handouts on diverticulosis and a high fiber diet. Resume previous medications. YOU HAD AN ENDOSCOPIC PROCEDURE TODAY AT THE [facility]:   Refer to the procedure report that was given to you for any specific questions about what was found during the examination.  If the procedure report does not answer your questions, please call your gastroenterologist to clarify.  If you requested that your care partner not be given the details of your procedure findings, then the procedure report has been included in a sealed envelope for you to review at your convenience later.  YOU SHOULD EXPECT: Some feelings of bloating in the abdomen. Passage of more gas than usual.  Walking can help get rid of the air that was put into your GI tract during the procedure and reduce the bloating. If you had a lower endoscopy (such as a colonoscopy or flexible sigmoidoscopy) you may notice spotting of blood in your stool or on the toilet paper. If you underwent a bowel prep for your procedure, you may not have a normal bowel movement for a few days.  Please Note:  You might notice some irritation and congestion in your nose or some drainage.  This is from the oxygen used during your procedure.  There is no need for concern and it should clear up in a day or so.  SYMPTOMS TO REPORT IMMEDIATELY:   Following lower endoscopy (colonoscopy or flexible sigmoidoscopy):  Excessive amounts of blood in the stool  Significant tenderness or worsening of abdominal pains  Swelling of the abdomen that is new, acute  Fever of 100F or higher   For urgent or emergent issues, a gastroenterologist can be reached at any hour by calling [phone].   DIET:  We do recommend a small meal at first, but then you may proceed to your regular diet.  Drink plenty of fluids but you should avoid alcoholic beverages for 24 hours.  ACTIVITY:  You should plan to take it easy for the rest of today and you  should NOT DRIVE or use heavy machinery until tomorrow (because of the sedation medicines used during the test).    FOLLOW UP: Our staff will call the number listed on your records the next business day following your procedure to check on you and address any questions or concerns that you may have regarding the information given to you following your procedure. If we do not reach you, we will leave a message.  However, if you are feeling well and you are not experiencing any problems, there is no need to return our call.  We will assume that you have returned to your regular daily activities without incident.  If any biopsies were taken you will be contacted by phone or by letter within the next 1-3 weeks.  Please call us at [phone] if you have not heard about the biopsies in 3 weeks.    SIGNATURES/CONFIDENTIALITY: You and/or your care partner have signed paperwork which will be entered into your electronic medical record.  These signatures attest to the fact that that the information above on your After Visit Summary has been reviewed and is understood.  Full responsibility of the confidentiality of this discharge information lies with you and/or your care-partner. 

## 2017-11-25 NOTE — Progress Notes (Signed)
Report to PACU, RN, vss, BBS= Clear.  

## 2017-11-25 NOTE — Progress Notes (Signed)
Pt's states no medical or surgical changes since previsit or office visit. 

## 2017-11-26 ENCOUNTER — Telehealth: Payer: Self-pay | Admitting: *Deleted

## 2017-11-26 NOTE — Telephone Encounter (Signed)
  Follow up Call-  Call back number 11/25/2017  Post procedure Call Back phone  # [phone]  Permission to leave phone message Yes  Some recent data might be hidden     Patient questions:  Do you have a fever, pain , or abdominal swelling? No. Pain Score  0 *  Have you tolerated food without any problems? Yes.    Have you been able to return to your normal activities? Yes.    Do you have any questions about your discharge instructions: Diet   No. Medications  No. Follow up visit  No.  Do you have questions or concerns about your Care? No.  Actions: * If pain score is 4 or above: No action needed, pain <4.

## 2018-04-16 ENCOUNTER — Ambulatory Visit (INDEPENDENT_AMBULATORY_CARE_PROVIDER_SITE_OTHER): Payer: BLUE CROSS/BLUE SHIELD | Admitting: Physician Assistant

## 2018-04-16 ENCOUNTER — Encounter: Payer: Self-pay | Admitting: Physician Assistant

## 2018-04-16 ENCOUNTER — Other Ambulatory Visit: Payer: Self-pay

## 2018-04-16 VITALS — BP 134/86 | HR 76 | Temp 98.3°F | Resp 16 | Ht 64.0 in | Wt 110.0 lb

## 2018-04-16 DIAGNOSIS — R112 Nausea with vomiting, unspecified: Secondary | ICD-10-CM | POA: Diagnosis not present

## 2018-04-16 DIAGNOSIS — R1084 Generalized abdominal pain: Secondary | ICD-10-CM

## 2018-04-16 LAB — CBC WITH DIFFERENTIAL/PLATELET
BASOS ABS: 44 {cells}/uL (ref 0–200)
BASOS PCT: 0.3 %
EOS ABS: 102 {cells}/uL (ref 15–500)
EOS PCT: 0.7 %
HCT: 43.4 % (ref 35.0–45.0)
HEMOGLOBIN: 14.9 g/dL (ref 11.7–15.5)
LYMPHS ABS: 2161 {cells}/uL (ref 850–3900)
MCH: 31.8 pg (ref 27.0–33.0)
MCHC: 34.3 g/dL (ref 32.0–36.0)
MCV: 92.7 fL (ref 80.0–100.0)
MONOS PCT: 6.5 %
MPV: 10.5 fL (ref 7.5–12.5)
NEUTROS ABS: 11344 {cells}/uL — AB (ref 1500–7800)
Neutrophils Relative %: 77.7 %
Platelets: 294 10*3/uL (ref 140–400)
RBC: 4.68 10*6/uL (ref 3.80–5.10)
RDW: 12.1 % (ref 11.0–15.0)
Total Lymphocyte: 14.8 %
WBC mixed population: 949 cells/uL (ref 200–950)
WBC: 14.6 10*3/uL — ABNORMAL HIGH (ref 3.8–10.8)

## 2018-04-16 LAB — TIQ-MISC

## 2018-04-16 MED ORDER — PROMETHAZINE HCL 25 MG PO TABS: 25.0000 mg | ORAL_TABLET | Freq: Four times a day (QID) | ORAL | 0 refills | Status: DC | PRN

## 2018-04-16 NOTE — Progress Notes (Signed)
Patient ID: Victoria Richardson MRN: [id number], DOB: November 02, 1960, 57 y.o. Date of Encounter: 04/16/2018, 2:45 PM    Chief Complaint:  Chief Complaint  Patient presents with  . Abdominal Pain    symptoms started monday   . Emesis  . Chills     HPI: 57 y.o. year old female presents with above.    She reports that symptoms started Sunday, July 28. States that she "has been having gas and burping." Also stomach has been cramping.  Points across bilateral abdomen and states that she has been feeling diffuse abdominal cramping. States that yesterday she finally felt like she had an appetite so she ate a sub-sandwich.  2 hours later she vomited. States that was  the only episode of vomiting she has had. Reports that she has had no diarrhea. States that she feels hot --> cold,  Hot --> cold. Reports that she was feeling perfectly fine prior to Sunday.  Was having no abdominal discomfort, no cramping, no nausea or anorexia prior to Sunday.  Was having no gas or burping prior to Sunday. Lives with her husband.  He is not sick with similar symptoms. She works at the [employer].  She went to work on Monday but had to leave early.  She was off yesterday.  This morning she went into work at 6 AM but left work at 12 noon when she had scheduled this appointment. Says that she has used Tums, gas ex, Pepto-Bismol with minimal relief. She was scheduled off yesterday which was Tuesday and is scheduled off again this Friday.  Is scheduled to work tomorrow which is Thursday and then again on Saturday.  No other concerns to address today.  Home Meds:   Outpatient Medications Prior to Visit  Medication Sig Dispense Refill  . Ascorbic Acid (VITAMIN C PO) Take by mouth.    . Melatonin 5 MG TABS Take 1 tablet by mouth at bedtime as needed.    . Multiple Vitamin (MULTIVITAMIN) capsule Take 1 capsule by mouth daily.     Facility-Administered Medications Prior to Visit  Medication Dose Route Frequency  Provider Last Rate Last Dose  . 0.9 %  sodium chloride infusion  500 mL Intravenous Once Milus Banister, MD        Allergies: No Known Allergies    Review of Systems: See HPI for pertinent ROS. All other ROS negative.    Physical Exam: Blood pressure 134/86, pulse 76, temperature 98.3 F (36.8 C), temperature source Oral, resp. rate 16, height 5\' 4"  (1.626 m), weight 49.9 kg (110 lb), SpO2 97 %., Body mass index is 18.88 kg/m. General: WNWD WF.  Appears in no acute distress.  Neck: Supple. No thyromegaly. No lymphadenopathy. Lungs: Clear bilaterally to auscultation without wheezes, rales, or rhonchi. Breathing is unlabored. Heart: Regular rhythm. No murmurs, rubs, or gallops. Abdomen: Soft, non-tender, non-distended with normoactive bowel sounds. No hepatomegaly. No rebound/guarding. No obvious abdominal masses.  There is no area of focal tenderness with palpation. Msk:  Strength and tone normal for age. Extremities/Skin: Warm and dry.  Neuro: Alert and oriented X 3. Moves all extremities spontaneously. Gait is normal. CNII-XII grossly in tact. Psych:  Responds to questions appropriately with a normal affect.     ASSESSMENT AND PLAN:  57 y.o. year old female with   1. Generalized abdominal pain I am sending CBC stat.  Will check CBC and CMET.  Will follow up accordingly. In the interim, I have told her to  stick with a clear liquid diet.  Recommend that she drink small sips of ginger ale.  Stay with clear liquids only.  Discussed reasons for bowel rest.  After 24 hours if nausea controlled, then can add plain crackers.  Can then gradually advance to bland diet as tolerated. Also will prescribe Phenergan to use as needed.  Cautioned that this may cause drowsiness. Note given to cover being out of work through Friday.  If symptoms worsen with increased focal abdominal pain or fever then go to ED.  If symptoms not resolved by Friday then follow-up for additional work note/further  evaluation if needed. - CBC with Differential/Platelet - COMPLETE METABOLIC PANEL WITH GFR - promethazine (PHENERGAN) 25 MG tablet; Take 1 tablet (25 mg total) by mouth every 6 (six) hours as needed for nausea or vomiting.  Dispense: 30 tablet; Refill: 0  2. Non-intractable vomiting with nausea, unspecified vomiting type I am sending CBC stat.  Will check CBC and CMET.  Will follow up accordingly. In the interim, I have told her to stick with a clear liquid diet.  Recommend that she drink small sips of ginger ale.  Stay with clear liquids only.  Discussed reasons for bowel rest.  After 24 hours if nausea controlled, then can add plain crackers.  Can then gradually advance to bland diet as tolerated. Also will prescribe Phenergan to use as needed.  Cautioned that this may cause drowsiness. Note given to cover being out of work through Friday.  If symptoms worsen with increased focal abdominal pain or fever then go to ED.  If symptoms not resolved by Friday then follow-up for additional work note/further evaluation if needed.  - CBC with Differential/Platelet - COMPLETE METABOLIC PANEL WITH GFR - promethazine (PHENERGAN) 25 MG tablet; Take 1 tablet (25 mg total) by mouth every 6 (six) hours as needed for nausea or vomiting.  Dispense: 30 tablet; Refill: 0   Signed, [address] [place], [place], [facility] 04/16/2018 2:45 PM

## 2018-04-17 ENCOUNTER — Other Ambulatory Visit: Payer: Self-pay

## 2018-04-17 ENCOUNTER — Other Ambulatory Visit: Payer: Self-pay | Admitting: Physician Assistant

## 2018-04-17 ENCOUNTER — Ambulatory Visit (HOSPITAL_COMMUNITY)
Admission: RE | Admit: 2018-04-17 | Discharge: 2018-04-17 | Disposition: A | Discharge status: Home / Self Care | Payer: BLUE CROSS/BLUE SHIELD | Source: Ambulatory Visit | Attending: Physician Assistant | Admitting: Physician Assistant

## 2018-04-17 DIAGNOSIS — K5730 Diverticulosis of large intestine without perforation or abscess without bleeding: Secondary | ICD-10-CM | POA: Diagnosis not present

## 2018-04-17 DIAGNOSIS — R112 Nausea with vomiting, unspecified: Secondary | ICD-10-CM | POA: Insufficient documentation

## 2018-04-17 DIAGNOSIS — R1084 Generalized abdominal pain: Secondary | ICD-10-CM

## 2018-04-17 DIAGNOSIS — I700 Atherosclerosis of aorta: Secondary | ICD-10-CM | POA: Insufficient documentation

## 2018-04-17 DIAGNOSIS — R109 Unspecified abdominal pain: Secondary | ICD-10-CM | POA: Diagnosis not present

## 2018-04-17 LAB — COMPLETE METABOLIC PANEL WITH GFR
AG RATIO: 1.8 (calc) (ref 1.0–2.5)
ALBUMIN MSPROF: 4.5 g/dL (ref 3.6–5.1)
ALT: 13 U/L (ref 6–29)
AST: 14 U/L (ref 10–35)
Alkaline phosphatase (APISO): 62 U/L (ref 33–130)
BUN: 12 mg/dL (ref 7–25)
CHLORIDE: 105 mmol/L (ref 98–110)
CO2: 30 mmol/L (ref 20–32)
CREATININE: 0.53 mg/dL (ref 0.50–1.05)
Calcium: 10 mg/dL (ref 8.6–10.4)
GFR, Est African American: 121 mL/min/{1.73_m2} (ref >=60)
GFR, Est Non African American: 105 mL/min/{1.73_m2} (ref >=60)
GLUCOSE: 104 mg/dL — AB (ref 65–99)
Globulin: 2.5 g/dL (calc) (ref 1.9–3.7)
Potassium: 4.8 mmol/L (ref 3.5–5.3)
Sodium: 143 mmol/L (ref 135–146)
TOTAL PROTEIN: 7 g/dL (ref 6.1–8.1)
Total Bilirubin: 0.5 mg/dL (ref 0.2–1.2)

## 2018-04-17 MED ORDER — METRONIDAZOLE 500 MG PO TABS: 500.0000 mg | ORAL_TABLET | Freq: Three times a day (TID) | ORAL | 0 refills | Status: DC

## 2018-04-17 MED ORDER — CIPROFLOXACIN HCL 500 MG PO TABS: 500.0000 mg | ORAL_TABLET | Freq: Two times a day (BID) | ORAL | 0 refills | Status: DC

## 2018-04-17 MED ORDER — IOHEXOL 300 MG/ML  SOLN
100.0000 mL | Freq: Once | INTRAMUSCULAR | Status: AC | PRN
  Administered 2018-04-17: 100 mL via INTRAVENOUS

## 2018-04-17 MED ORDER — IOPAMIDOL (ISOVUE-300) INJECTION 61%
INTRAVENOUS | Status: AC
  Filled 2018-04-17: qty 30

## 2018-04-17 NOTE — Progress Notes (Signed)
cipro

## 2019-04-02 ENCOUNTER — Encounter: Payer: Self-pay | Admitting: Family Medicine

## 2019-04-02 ENCOUNTER — Other Ambulatory Visit: Payer: Self-pay

## 2019-04-02 ENCOUNTER — Ambulatory Visit (INDEPENDENT_AMBULATORY_CARE_PROVIDER_SITE_OTHER): Payer: BC Managed Care – PPO | Admitting: Family Medicine

## 2019-04-02 VITALS — BP 128/68 | HR 92 | Temp 98.6°F | Resp 16 | Ht 64.0 in | Wt 104.0 lb

## 2019-04-02 DIAGNOSIS — Z0001 Encounter for general adult medical examination with abnormal findings: Secondary | ICD-10-CM | POA: Diagnosis not present

## 2019-04-02 DIAGNOSIS — Z0000 Encounter for general adult medical examination without abnormal findings: Secondary | ICD-10-CM | POA: Diagnosis not present

## 2019-04-02 DIAGNOSIS — R634 Abnormal weight loss: Secondary | ICD-10-CM

## 2019-04-02 DIAGNOSIS — R1084 Generalized abdominal pain: Secondary | ICD-10-CM | POA: Diagnosis not present

## 2019-04-02 DIAGNOSIS — R7989 Other specified abnormal findings of blood chemistry: Secondary | ICD-10-CM | POA: Diagnosis not present

## 2019-04-02 DIAGNOSIS — F17200 Nicotine dependence, unspecified, uncomplicated: Secondary | ICD-10-CM

## 2019-04-02 DIAGNOSIS — K5792 Diverticulitis of intestine, part unspecified, without perforation or abscess without bleeding: Secondary | ICD-10-CM | POA: Diagnosis not present

## 2019-04-02 DIAGNOSIS — Z124 Encounter for screening for malignant neoplasm of cervix: Secondary | ICD-10-CM | POA: Diagnosis not present

## 2019-04-02 DIAGNOSIS — Z803 Family history of malignant neoplasm of breast: Secondary | ICD-10-CM

## 2019-04-02 DIAGNOSIS — Z01411 Encounter for gynecological examination (general) (routine) with abnormal findings: Secondary | ICD-10-CM

## 2019-04-02 DIAGNOSIS — Z1159 Encounter for screening for other viral diseases: Secondary | ICD-10-CM

## 2019-04-02 DIAGNOSIS — Z114 Encounter for screening for human immunodeficiency virus [HIV]: Secondary | ICD-10-CM

## 2019-04-02 DIAGNOSIS — Z1239 Encounter for other screening for malignant neoplasm of breast: Secondary | ICD-10-CM

## 2019-04-02 DIAGNOSIS — I700 Atherosclerosis of aorta: Secondary | ICD-10-CM

## 2019-04-02 MED ORDER — CIPROFLOXACIN HCL 500 MG PO TABS: 500.0000 mg | ORAL_TABLET | Freq: Two times a day (BID) | ORAL | 0 refills | Status: AC

## 2019-04-02 MED ORDER — METRONIDAZOLE 500 MG PO TABS: 500.0000 mg | ORAL_TABLET | Freq: Three times a day (TID) | ORAL | 0 refills | Status: AC

## 2019-04-02 NOTE — Progress Notes (Signed)
Patient: Victoria Richardson, Female    DOB: 23-Sep-1960, 58 y.o.   MRN: [id number] Visit Date: 04/02/2019  Today's Provider: Delsa Grana, PA-C   Chief Complaint  Patient presents with  . Gynecologic Exam    is fasting   Subjective:    Annual physical exam Victoria Richardson is a 58 y.o. female who presents today for health maintenance and complete physical. She feels poorly - see below, GI issues/weightloss/tired. She reports not exercising. She reports she is sleeping well.  -----------------------------------------------------------------   44 years smoking, 1 ppd - denies chronic cough, wheeze, SOB, CP, exertional sx, hemoptysis  Menopause 4 years ago, last PAP 3 years ago, sexually active with husband for 40+ years)  Weight loss 10 lbs since being diagnosed last year with diverticulitis.  she has had multiple flares with diarrhea since she was diagnosed last year but she has not been seen for follow-up.  She reports the symptoms come on very suddenly after eating certain foods, she has pain pressure and cramping across her low pelvis but she describes the pain as severe menstrual cramps or having a baby and she has tenesmus.  Last occurrence was about 3 weeks ago.  She is finding that fruit such as watermelon or eating a peanut butter and jelly sandwich cause her symptoms.  She is also having severe GI upset very frequently with lots of other foods including dairy, which she has never had before, and her symptoms are all over bloating, cramping, indigestion, belching and flatus, nausea and diarrhea.  She is having symptoms of frequently that she is not eating as much is normal and she is at the lowest weight she is ever been, also more days and not she is having symptoms, pain and nausea and has to avoid eating for them to improve.  She denies melena, hematochezia.  Reviewed CT imaging from August 2019 when she was diagnosed with diverticulitis.  The CT scan did show evidence of aortic  atherosclerosis but I do not believe is on her chart I will update today.  She is due for mammogram states she has not done more in a long time, and her mother does have history of breast cancer, no other female relatives have any known breast cancer.  She believes her mother was diagnosed in her 40s   Review of Systems  Constitutional: Negative.  Negative for activity change, appetite change, fatigue and unexpected weight change.  HENT: Negative.   Eyes: Negative.   Respiratory: Negative.  Negative for cough, chest tightness, shortness of breath and wheezing.   Cardiovascular: Negative.  Negative for chest pain, palpitations and leg swelling.  Gastrointestinal: Negative.  Negative for abdominal pain and blood in stool.  Endocrine: Negative.  Negative for cold intolerance and heat intolerance.  Genitourinary: Negative.   Musculoskeletal: Negative.  Negative for arthralgias, gait problem, joint swelling and myalgias.  Skin: Negative.  Negative for color change, pallor and rash.  Allergic/Immunologic: Negative.   Neurological: Negative.  Negative for syncope and weakness.  Hematological: Negative.   Psychiatric/Behavioral: Negative.  Negative for confusion, dysphoric mood, self-injury and suicidal ideas. The patient is not nervous/anxious.   All other systems reviewed and are negative.   Social History      She  reports that she has been smoking cigarettes. She has a 45.00 pack-year smoking history. She has never used smokeless tobacco. She reports current alcohol use. She reports that she does not use drugs.  Social History   Socioeconomic History  . Marital status: Married    Spouse name: Not on file  . Number of children: Not on file  . Years of education: Not on file  . Highest education level: Not on file  Occupational History  . Not on file  Social Needs  . Financial resource strain: Not on file  . Food insecurity    Worry: Not on file    Inability: Not on file  .  Transportation needs    Medical: Not on file    Non-medical: Not on file  Tobacco Use  . Smoking status: Current Every Day Smoker    Packs/day: 1.00    Years: 45.00    Pack years: 45.00    Types: Cigarettes  . Smokeless tobacco: Never Used  Substance and Sexual Activity  . Alcohol use: Yes    Alcohol/week: 0.0 standard drinks    Comment: rarely  . Drug use: No  . Sexual activity: Not Currently  Lifestyle  . Physical activity    Days per week: Not on file    Minutes per session: Not on file  . Stress: Not on file  Relationships  . Social Herbalist on phone: Not on file    Gets together: Not on file    Attends religious service: Not on file    Active member of club or organization: Not on file    Attends meetings of clubs or organizations: Not on file    Relationship status: Not on file  Other Topics Concern  . 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   Married.    Smokes---    Past Medical History:  Diagnosis Date  . Allergy      Patient Active Problem List   Diagnosis Date Noted  . Family history of breast cancer in mother 04/02/2019  . Weight loss 04/02/2019  . Abnormal TSH 04/02/2019  . Diverticulitis 04/02/2019  . Smoker 01/30/2016  . Encounter for smoking cessation counseling 01/30/2016  . HYPERLIPIDEMIA 12/15/2007  . CERVICAL POLYP 12/15/2007  . RECTAL BLEEDING, HX OF 12/15/2007    Past Surgical History:  Procedure Laterality Date  . COLONOSCOPY    . tubaligation      Family History        Family Status  Relation Name Status  . Mother  Alive  . Father  Deceased  . Sister none Alive  . Brother  Alive  . MGM  Deceased       22  . Neg Hx  (Not Specified)        Her family history includes Cancer in her mother; Colon cancer in her maternal grandmother; Colon polyps in her mother; Heart disease (age of onset: 60) in her father. There is no history of  Esophageal cancer, Rectal cancer, Stomach cancer, or Pancreatic cancer.      No Known Allergies   Current Outpatient Medications:  .  Ascorbic Acid (VITAMIN C PO), Take by mouth., Disp: , Rfl:  .  Multiple Vitamin (MULTIVITAMIN) capsule, Take 1 capsule by mouth daily., Disp: , Rfl:  .  ciprofloxacin (CIPRO) 500 MG tablet, Take 1 tablet (500 mg total) by mouth 2 (two) times daily for 7 days., Disp: 14 tablet, Rfl: 0 .  metroNIDAZOLE (FLAGYL) 500 MG tablet, Take 1 tablet (500 mg total) by mouth 3 (three) times daily for 7 days., Disp: 21 tablet, Rfl: 0  Current Facility-Administered Medications:  .  0.9 %  sodium chloride infusion, 500 mL, Intravenous, Once, Milus Banister, MD   Patient Care Team: Susy Frizzle, MD as PCP - General (Family Medicine)      Objective:   Vitals: BP 128/68   Pulse 92   Temp 98.6 F (37 C) (Oral)   Resp 16   Ht 5\' 4"  (1.626 m)   Wt 104 lb (47.2 kg)   SpO2 96%   BMI 17.85 kg/m    Vitals:   04/02/19 0836  BP: 128/68  Pulse: 92  Resp: 16  Temp: 98.6 F (37 C)  TempSrc: Oral  SpO2: 96%  Weight: 104 lb (47.2 kg)  Height: 5\' 4"  (1.626 m)     Physical Exam Vitals signs and nursing note reviewed. Exam conducted with a chaperone present.  Constitutional:      General: She is not in acute distress.    Appearance: Normal appearance. She is well-developed and underweight. She is not toxic-appearing or diaphoretic.     Comments: Thin female, appears older than stated age, NAD  HENT:     Head: Normocephalic and atraumatic.     Right Ear: External ear normal.     Left Ear: External ear normal.     Nose: Nose normal.     Mouth/Throat:     Pharynx: Uvula midline.  Eyes:     General: Lids are normal.        Right eye: No discharge.        Left eye: No discharge.     Conjunctiva/sclera: Conjunctivae normal.     Pupils: Pupils are equal, round, and reactive to light.  Neck:     Musculoskeletal: Normal range of motion and neck supple.      Trachea: Phonation normal. No tracheal deviation.  Cardiovascular:     Rate and Rhythm: Normal rate and regular rhythm.     Chest Wall: PMI is not displaced.     Pulses: Normal pulses.          Radial pulses are 2+ on the right side and 2+ on the left side.       Posterior tibial pulses are 2+ on the right side and 2+ on the left side.     Heart sounds: Normal heart sounds. No murmur. No friction rub. No gallop.   Pulmonary:     Effort: Pulmonary effort is normal. No tachypnea, accessory muscle usage or respiratory distress.     Breath sounds: Normal breath sounds. No stridor or decreased air movement. No decreased breath sounds, wheezing, rhonchi or rales.  Chest:     Chest wall: No mass, deformity, swelling or tenderness.     Breasts: Breasts are symmetrical.        Right: Normal. No swelling, inverted nipple, mass, skin change or tenderness.        Left: Normal. No swelling, inverted nipple, mass, skin change or tenderness.  Abdominal:     General: Bowel sounds are normal. There is no distension.     Palpations: Abdomen is soft.     Tenderness: There is no abdominal tenderness. There is no guarding or rebound.  Genitourinary:    Pubic Area: No rash.      Labia:        Right: No rash, tenderness, lesion or injury.        Left: No rash, tenderness, lesion or injury.      Vagina: Normal. No vaginal discharge, erythema, tenderness or prolapsed  vaginal walls.     Cervix: Lesion and erythema present. No cervical motion tenderness, discharge, friability, cervical bleeding or eversion.     Uterus: Normal.      Adnexa: Right adnexa normal and left adnexa normal.       Right: No mass, tenderness or fullness.         Left: No mass, tenderness or fullness.       Comments: Atrophic vulvavaginal tissue PAP and bimanual exam done Musculoskeletal: Normal range of motion.        General: No deformity.     Right lower leg: No edema.     Left lower leg: No edema.  Lymphadenopathy:     Cervical:  No cervical adenopathy.     Upper Body:     Right upper body: No supraclavicular, axillary or pectoral adenopathy.     Left upper body: No supraclavicular, axillary or pectoral adenopathy.  Skin:    General: Skin is warm and dry.     Capillary Refill: Capillary refill takes less than 2 seconds.     Coloration: Skin is not jaundiced or pale.     Findings: No bruising or rash.  Neurological:     Mental Status: She is alert and oriented to person, place, and time.     Motor: No abnormal muscle tone.     Gait: Gait normal.  Psychiatric:        Speech: Speech normal.        Behavior: Behavior normal. Behavior is cooperative.      Depression Screen PHQ 2/9 Scores 04/02/2019 07/25/2017 01/30/2016  PHQ - 2 Score 2 0 0  PHQ- 9 Score 9 - -     Office Visit from 04/02/2019 in [place]  AUDIT-C Score  0         Assessment & Plan:     Routine Health Maintenance and Physical Exam  Discussed health benefits of physical activity, and encouraged her to engage in regular exercise appropriate for her age and condition.   Immunization History  Administered Date(s) Administered  . Influenza, Seasonal, Injecte, Preservative Fre 07/07/2018  . Influenza,inj,Quad PF,6+ Mos 06/04/2017, 07/07/2018  . Influenza-Unspecified 06/04/2017  . Pneumococcal Polysaccharide-23 01/30/2016  . Tdap 01/30/2016    Health Maintenance  Topic Date Due  . Hepatitis C Screening  [birth year]  . HIV Screening  06/09/1974  . MAMMOGRAM  12/27/2016  . PAP SMEAR-Modifier  01/30/2019  . INFLUENZA VACCINE  04/18/2019  . TETANUS/TDAP  01/29/2026  . COLONOSCOPY  11/26/2027     Hep C, HIV screen, mammogram, PAP/Pelvic and breast exam done today Advised to return for flu shot     ICD-10-CM   1. General medical exam  Z00.00 Lipid panel    CBC with Differential/Platelet    COMPLETE METABOLIC PANEL WITH GFR   CPE done, neg PHQ and audit, smoking cessation counseling given,  screenings/wellness/care gaps addressed  2. 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 Diverticulitis  K57.92 Ambulatory referral to Gastroenterology    ciprofloxacin (CIPRO) 500 MG tablet    metroNIDAZOLE (FLAGYL) 500 MG tablet   many "episodes" over the past year, cipro/flagyl and liquid diet with flare  4. Screening for malignant neoplasm of breast  Z12.39 MM Digital  Screening   breast exam done, normal, mammogram ordered  5. Screening for malignant neoplasm of cervix  Z12.4 PAP, Thin Prep w/HPV rflx HPV Type 16/18   PAP and pelvic done today, some lesions or erythematous spots on cervic, endocervical area and OS was normal appearing - follow PAP and HPV results  6. Abnormal TSH  R79.89 T4, free    TSH   abnormal in the past, recheck labs.  some weightloss, r/o hyperthyroid as a cause  7. Weight loss  V94.8 COMPLETE METABOLIC PANEL WITH GFR    T4, free    TSH    HIV Antibody (routine testing w rflx)    Hepatitis C antibody   suspect secondary to GI issues for past year referred to GI, r/o hyperthyroid, monitor respiratory sx with significant smoking hx, no SOB, cough  8. Need for hepatitis C screening test  Z11.59 Hepatitis C antibody  9. Screening for HIV without presence of risk factors  Z11.4 HIV Antibody (routine testing w rflx)   closing screening HIV gap  10. Smoker  F17.200 Lipid panel    CBC with Differential/Platelet    COMPLETE METABOLIC PANEL WITH GFR   45 pack year hx, desires to quit but only successful in the past with chantix and cannot afford, counseling given, so resp sx but weightloss  11. Family history of breast cancer in mother  Z62.3 MM Digital Screening  12. Aortic atherosclerosis (HCC)  I70.0    with  smoking hx and atherosclerosis disease, pt needs statin, high risk for CVD or events, will f/up with lab results for recommendations       Delsa Grana, PA-C 04/02/19 1:35 PM  [facility]

## 2019-04-02 NOTE — Patient Instructions (Addendum)
We will call you with your lab results. Please set up mychart for easier access to your medical information.  If there is anything we need you to follow up on we will discuss that and the follow up time when we call you to relay your test results.  Health Maintenance, Female Adopting a healthy lifestyle and getting preventive care are important in promoting health and wellness. Ask your health care provider about:  The right schedule for you to have regular tests and exams.  Things you can do on your own to prevent diseases and keep yourself healthy. What should I know about diet, weight, and exercise? Eat a healthy diet   Eat a diet that includes plenty of vegetables, fruits, low-fat dairy products, and lean protein.  Do not eat a lot of foods that are high in solid fats, added sugars, or sodium. Maintain a healthy weight Body mass index (BMI) is used to identify weight problems. It estimates body fat based on height and weight. Your health care provider can help determine your BMI and help you achieve or maintain a healthy weight. Get regular exercise Get regular exercise. This is one of the most important things you can do for your health. Most adults should:  Exercise for at least 150 minutes each week. The exercise should increase your heart rate and make you sweat (moderate-intensity exercise).  Do strengthening exercises at least twice a week. This is in addition to the moderate-intensity exercise.  Spend less time sitting. Even light physical activity can be beneficial. Watch cholesterol and blood lipids Have your blood tested for lipids and cholesterol at 61 years of age, then have this test every 5 years. Have your cholesterol levels checked more often if:  Your lipid or cholesterol levels are high.  You are older than 60 years of age.  You are at high risk for heart disease. What should I know about cancer screening? Depending on your health history and family  history, you may need to have cancer screening at various ages. This may include screening for:  Breast cancer.  Cervical cancer.  Colorectal cancer.  Skin cancer.  Lung cancer. What should I know about heart disease, diabetes, and high blood pressure? Blood pressure and heart disease  High blood pressure causes heart disease and increases the risk of stroke. This is more likely to develop in people who have high blood pressure readings, are of African descent, or are overweight.  Have your blood pressure checked: ? Every 3-5 years if you are 69-36 years of age. ? Every year if you are 35 years old or older. Diabetes Have regular diabetes screenings. This checks your fasting blood sugar level. Have the screening done:  Once every three years after age 32 if you are at a normal weight and have a low risk for diabetes.  More often and at a younger age if you are overweight or have a high risk for diabetes. What should I know about preventing infection? Hepatitis B If you have a higher risk for hepatitis B, you should be screened for this virus. Talk with your health care provider to find out if you are at risk for hepatitis B infection. Hepatitis C Testing is recommended for:  Everyone born from [birth year] through 1967.  Anyone with known risk factors for hepatitis C. Sexually transmitted infections (STIs)  Get screened for STIs, including gonorrhea and chlamydia, if: ? You are sexually active and are younger than 60 years of age. ? You are  older than 59 years of age and your health care provider tells you that you are at risk for this type of infection. ? Your sexual activity has changed since you were last screened, and you are at increased risk for chlamydia or gonorrhea. Ask your health care provider if you are at risk.  Ask your health care provider about whether you are at high risk for HIV. Your health care provider may recommend a prescription medicine to help prevent HIV  infection. If you choose to take medicine to prevent HIV, you should first get tested for HIV. You should then be tested every 3 months for as long as you are taking the medicine. Pregnancy  If you are about to stop having your period (premenopausal) and you may become pregnant, seek counseling before you get pregnant.  Take 400 to 800 micrograms (mcg) of folic acid every day if you become pregnant.  Ask for birth control (contraception) if you want to prevent pregnancy. Osteoporosis and menopause Osteoporosis is a disease in which the bones lose minerals and strength with aging. This can result in bone fractures. If you are 76 years old or older, or if you are at risk for osteoporosis and fractures, ask your health care provider if you should:  Be screened for bone loss.  Take a calcium or vitamin D supplement to lower your risk of fractures.  Be given hormone replacement therapy (HRT) to treat symptoms of menopause. Follow these instructions at home: Lifestyle  Do not use any products that contain nicotine or tobacco, such as cigarettes, e-cigarettes, and chewing tobacco. If you need help quitting, ask your health care provider.  Do not use street drugs.  Do not share needles.  Ask your health care provider for help if you need support or information about quitting drugs. Alcohol use  Do not drink alcohol if: ? Your health care provider tells you not to drink. ? You are pregnant, may be pregnant, or are planning to become pregnant.  If you drink alcohol: ? Limit how much you use to 0-1 drink a day. ? Limit intake if you are breastfeeding.  Be aware of how much alcohol is in your drink. In the [place] one drink equals one 12 oz bottle of beer (355 mL), one 5 oz glass of wine (148 mL), or one 1 oz glass of hard liquor (44 mL). General instructions  Schedule regular health, dental, and eye exams.  Stay current with your vaccines.  Tell your health care provider if: ? You  often feel depressed. ? You have ever been abused or do not feel safe at home. Summary  Adopting a healthy lifestyle and getting preventive care are important in promoting health and wellness.  Follow your health care provider's instructions about healthy diet, exercising, and getting tested or screened for diseases.  Follow your health care provider's instructions on monitoring your cholesterol and blood pressure. This information is not intended to replace advice given to you by your health care provider. Make sure you discuss any questions you have with your health care provider. Document Released: 03/19/2011 Document Revised: 08/27/2018 Document Reviewed: 08/27/2018 Elsevier Patient Education  [place].  Diverticulitis  Diverticulitis is when small pockets in your large intestine (colon) get infected or swollen. This causes stomach pain and watery poop (diarrhea). These pouches are called diverticula. They form in people who have a condition called diverticulosis. Follow these instructions at home: Medicines  Take over-the-counter and prescription medicines only as told by  your doctor. These include: ? Antibiotics. ? Pain medicines. ? Fiber pills. ? Probiotics. ? Stool softeners.  Do not drive or use heavy machinery while taking prescription pain medicine.  If you were prescribed an antibiotic, take it as told. Do not stop taking it even if you feel better. General instructions   Follow a diet as told by your doctor.  When you feel better, your doctor may tell you to change your diet. You may need to eat a lot of fiber. Fiber makes it easier to poop (have bowel movements). Healthy foods with fiber include: ? Berries. ? Beans. ? Lentils. ? Green vegetables.  Exercise 3 or more times a week. Aim for 30 minutes each time. Exercise enough to sweat and make your heart beat faster.  Keep all follow-up visits as told. This is important. You may need to have an exam  of the large intestine. This is called a colonoscopy. Contact a doctor if:  Your pain does not get better.  You have a hard time eating or drinking.  You are not pooping like normal. Get help right away if:  Your pain gets worse.  Your problems do not get better.  Your problems get worse very fast.  You have a fever.  You throw up (vomit) more than one time.  You have poop that is: ? Bloody. ? Black. ? Tarry. Summary  Diverticulitis is when small pockets in your large intestine (colon) get infected or swollen.  Take medicines only as told by your doctor.  Follow a diet as told by your doctor. This information is not intended to replace advice given to you by your health care provider. Make sure you discuss any questions you have with your health care provider. Document Released: 02/20/2008 Document Revised: 08/16/2017 Document Reviewed: 09/20/2016 Elsevier Patient Education  [place].   Irritable Bowel Syndrome, Adult  Irritable bowel syndrome (IBS) is a group of symptoms that affects the organs responsible for digestion (gastrointestinal or GI tract). IBS is not one specific disease. To regulate how the GI tract works, the body sends signals back and forth between the intestines and the brain. If you have IBS, there may be a problem with these signals. As a result, the GI tract does not function normally. The intestines may become more sensitive and overreact to certain things. This may be especially true when you eat certain foods or when you are under stress. There are four types of IBS. These may be determined based on the consistency of your stool (feces):  IBS with diarrhea.  IBS with constipation.  Mixed IBS.  Unsubtyped IBS. It is important to know which type of IBS you have. Certain treatments are more likely to be helpful for certain types of IBS. What are the causes? The exact cause of IBS is not known. What increases the risk? You may have a  higher risk for IBS if you:  Are female.  Are younger than 64.  Have a family history of IBS.  Have a mental health condition, such as depression, anxiety, or post-traumatic stress disorder.  Have had a bacterial infection of your GI tract. What are the signs or symptoms? Symptoms of IBS vary from person to person. The main symptom is abdominal pain or discomfort. Other symptoms usually include one or more of the following:  Diarrhea, constipation, or both.  Abdominal swelling or bloating.  Feeling full after eating a small or regular-sized meal.  Frequent gas.  Mucus in the stool.  A feeling of having more stool left after a bowel movement. Symptoms tend to come and go. They may be triggered by stress, mental health conditions, or certain foods. How is this diagnosed? This condition may be diagnosed based on a physical exam, your medical history, and your symptoms. You may have tests, such as:  Blood tests.  Stool test.  X-rays.  CT scan.  Colonoscopy. This is a procedure in which your GI tract is viewed with a long, thin, flexible tube. How is this treated? There is no cure for IBS, but treatment can help relieve symptoms. Treatment depends on the type of IBS you have, and may include:  Changes to your diet, such as: ? Avoiding foods that cause symptoms. ? Drinking more water. ? Following a low-FODMAP (fermentable oligosaccharides, disaccharides, monosaccharides, and polyols) diet for up to 6 weeks, or as told by your health care provider. FODMAPs are sugars that are hard for some people to digest. ? Eating more fiber. ? Eating medium-sized meals at the same times every day.  Medicines. These may include: ? Fiber supplements, if you have constipation. ? Medicine to control diarrhea (antidiarrheal medicines). ? Medicine to help control muscle tightening (spasms) in your GI tract (antispasmodic medicines). ? Medicines to help with mental health conditions, such as  antidepressants or tranquilizers.  Talk therapy or counseling.  Working with a diet and nutrition specialist (dietitian) to help create a food plan that is right for you.  Managing your stress. Follow these instructions at home: Eating and drinking  Eat a healthy diet.  Eat medium-sized meals at about the same time every day. Do not eat large meals.  Gradually eat more fiber-rich foods. These include whole grains, fruits, and vegetables. This may be especially helpful if you have IBS with constipation.  Eat a diet low in FODMAPs.  Drink enough fluid to keep your urine pale yellow.  Keep a journal of foods that seem to trigger symptoms.  Avoid foods and drinks that: ? Contain added sugar. ? Make your symptoms worse. Dairy products, caffeinated drinks, and carbonated drinks can make symptoms worse for some people. General instructions  Take over-the-counter and prescription medicines and supplements only as told by your health care provider.  Get enough exercise. Do at least 150 minutes of moderate-intensity exercise each week.  Manage your stress. Getting enough sleep and exercise can help you manage stress.  Keep all follow-up visits as told by your health care provider and therapist. This is important. Alcohol Use  Do not drink alcohol if: ? Your health care provider tells you not to drink. ? You are pregnant, may be pregnant, or are planning to become pregnant.  If you drink alcohol, limit how much you have: ? 0-1 drink a day for women. ? 0-2 drinks a day for men.  Be aware of how much alcohol is in your drink. In the [place] one drink equals one typical bottle of beer (12 oz), one-half glass of wine (5 oz), or one shot of hard liquor (1 oz). Contact a health care provider if you have:  Constant pain.  Weight loss.  Difficulty or pain when swallowing.  Diarrhea that gets worse. Get help right away if you have:  Severe abdominal pain.  Fever.  Diarrhea with  symptoms of dehydration, such as dizziness or dry mouth.  Bright red blood in your stool.  Stool that is black and tarry.  Abdominal swelling.  Vomiting that does not stop.  Blood in your vomit.  Summary  Irritable bowel syndrome (IBS) is not one specific disease. It is a group of symptoms that affects digestion.  Your intestines may become more sensitive and overreact to certain things. This may be especially true when you eat certain foods or when you are under stress.  There is no cure for IBS, but treatment can help relieve symptoms. This information is not intended to replace advice given to you by your health care provider. Make sure you discuss any questions you have with your health care provider. Document Released: 09/03/2005 Document Revised: 08/27/2017 Document Reviewed: 08/27/2017 Elsevier Patient Education  2020 [employer].

## 2019-04-03 LAB — LIPID PANEL
Cholesterol: 176 mg/dL (ref <200)
HDL: 59 mg/dL (ref >=50)
LDL Cholesterol (Calc): 98 mg/dL (calc)
Non-HDL Cholesterol (Calc): 117 mg/dL (calc) (ref <130)
Total CHOL/HDL Ratio: 3 (calc) (ref <5.0)
Triglycerides: 99 mg/dL (ref <150)

## 2019-04-03 LAB — COMPLETE METABOLIC PANEL WITH GFR
AG Ratio: 1.7 (calc) (ref 1.0–2.5)
ALT: 11 U/L (ref 6–29)
AST: 10 U/L (ref 10–35)
Albumin: 4.4 g/dL (ref 3.6–5.1)
Alkaline phosphatase (APISO): 57 U/L (ref 37–153)
BUN: 9 mg/dL (ref 7–25)
CO2: 26 mmol/L (ref 20–32)
Calcium: 9.8 mg/dL (ref 8.6–10.4)
Chloride: 105 mmol/L (ref 98–110)
Creat: 0.54 mg/dL (ref 0.50–1.05)
GFR, Est African American: 120 mL/min/{1.73_m2} (ref >=60)
GFR, Est Non African American: 103 mL/min/{1.73_m2} (ref >=60)
Globulin: 2.6 g/dL (calc) (ref 1.9–3.7)
Glucose, Bld: 95 mg/dL (ref 65–99)
Potassium: 3.9 mmol/L (ref 3.5–5.3)
Sodium: 143 mmol/L (ref 135–146)
Total Bilirubin: 0.8 mg/dL (ref 0.2–1.2)
Total Protein: 7 g/dL (ref 6.1–8.1)

## 2019-04-03 LAB — CBC WITH DIFFERENTIAL/PLATELET
Absolute Monocytes: 577 cells/uL (ref 200–950)
Basophils Absolute: 71 cells/uL (ref 0–200)
Basophils Relative: 0.9 %
Eosinophils Absolute: 427 cells/uL (ref 15–500)
Eosinophils Relative: 5.4 %
HCT: 47.3 % — ABNORMAL HIGH (ref 35.0–45.0)
Hemoglobin: 16.2 g/dL — ABNORMAL HIGH (ref 11.7–15.5)
Lymphs Abs: 2101 cells/uL (ref 850–3900)
MCH: 33.6 pg — ABNORMAL HIGH (ref 27.0–33.0)
MCHC: 34.2 g/dL (ref 32.0–36.0)
MCV: 98.1 fL (ref 80.0–100.0)
MPV: 10.6 fL (ref 7.5–12.5)
Monocytes Relative: 7.3 %
Neutro Abs: 4724 cells/uL (ref 1500–7800)
Neutrophils Relative %: 59.8 %
Platelets: 316 10*3/uL (ref 140–400)
RBC: 4.82 10*6/uL (ref 3.80–5.10)
RDW: 12 % (ref 11.0–15.0)
Total Lymphocyte: 26.6 %
WBC: 7.9 10*3/uL (ref 3.8–10.8)

## 2019-04-03 LAB — TSH: TSH: 0.09 mIU/L — ABNORMAL LOW (ref 0.40–4.50)

## 2019-04-03 LAB — T4, FREE: Free T4: 1.2 ng/dL (ref 0.8–1.8)

## 2019-04-03 LAB — HEPATITIS C ANTIBODY
Hepatitis C Ab: NONREACTIVE
SIGNAL TO CUT-OFF: 0.01 (ref <1.00)

## 2019-04-03 LAB — HIV ANTIBODY (ROUTINE TESTING W REFLEX): HIV 1&2 Ab, 4th Generation: NONREACTIVE

## 2019-04-07 ENCOUNTER — Other Ambulatory Visit: Payer: Self-pay | Admitting: Family Medicine

## 2019-04-07 DIAGNOSIS — R636 Underweight: Secondary | ICD-10-CM

## 2019-04-07 DIAGNOSIS — R7989 Other specified abnormal findings of blood chemistry: Secondary | ICD-10-CM

## 2019-04-07 DIAGNOSIS — R634 Abnormal weight loss: Secondary | ICD-10-CM

## 2019-04-07 DIAGNOSIS — R718 Other abnormality of red blood cells: Secondary | ICD-10-CM

## 2019-04-07 NOTE — Progress Notes (Signed)
TSH low, and lower than past TSH.  With unintentional weight loss will have pt return for follow up thyroid tests to r/o hyperthyroid or mild graves.  Reviewing old records revealed aortic atherosclerosis.   Discussed with pt and decided to get fasting labs and eval for need for statin The 10-year ASCVD risk score Mikey Bussing DC Brooke Bonito., et al., 2013) is: 5.5%   Values used to calculate the score:     Age: 58 years     Sex: Female     Is Non-Hispanic African American: No     Diabetic: No     Tobacco smoker: Yes     Systolic Blood Pressure: 825 mmHg     Is BP treated: No     HDL Cholesterol: 59 mg/dL     Total Cholesterol: 176 mg/dL  Risk with smoking is 5.5% Continue to monitor and work on heart healthy diet and exercise, smoking cessation.

## 2019-04-09 LAB — PAP IG W/ RFLX HPV ASCU

## 2019-04-13 ENCOUNTER — Other Ambulatory Visit: Payer: BC Managed Care – PPO

## 2019-04-13 ENCOUNTER — Other Ambulatory Visit: Payer: Self-pay

## 2019-04-13 DIAGNOSIS — R718 Other abnormality of red blood cells: Secondary | ICD-10-CM

## 2019-04-13 DIAGNOSIS — R636 Underweight: Secondary | ICD-10-CM | POA: Diagnosis not present

## 2019-04-13 DIAGNOSIS — R634 Abnormal weight loss: Secondary | ICD-10-CM

## 2019-04-13 DIAGNOSIS — R7989 Other specified abnormal findings of blood chemistry: Secondary | ICD-10-CM | POA: Diagnosis not present

## 2019-04-16 LAB — CBC
HCT: 43.1 % (ref 35.0–45.0)
Hemoglobin: 15.1 g/dL (ref 11.7–15.5)
MCH: 33.6 pg — ABNORMAL HIGH (ref 27.0–33.0)
MCHC: 35 g/dL (ref 32.0–36.0)
MCV: 95.8 fL (ref 80.0–100.0)
MPV: 10.4 fL (ref 7.5–12.5)
Platelets: 308 10*3/uL (ref 140–400)
RBC: 4.5 10*6/uL (ref 3.80–5.10)
RDW: 12.1 % (ref 11.0–15.0)
WBC: 7.4 10*3/uL (ref 3.8–10.8)

## 2019-04-16 LAB — T3, FREE: T3, Free: 3.2 pg/mL (ref 2.3–4.2)

## 2019-04-16 LAB — TRAB (TSH RECEPTOR BINDING ANTIBODY): TRAB: <1 IU/L (ref <=2.00)

## 2019-04-17 NOTE — Progress Notes (Signed)
So far additional thyroid tests are also normal.  One test is pending and we will follow up when resulted or if there is a lab error.  Thank you -please notify pt

## 2019-04-23 ENCOUNTER — Ambulatory Visit
Admission: RE | Admit: 2019-04-23 | Discharge: 2019-04-23 | Disposition: A | Discharge status: Home / Self Care | Payer: BLUE CROSS/BLUE SHIELD | Source: Ambulatory Visit | Attending: Family Medicine | Admitting: Family Medicine

## 2019-04-23 ENCOUNTER — Other Ambulatory Visit: Payer: Self-pay

## 2019-04-23 DIAGNOSIS — Z1231 Encounter for screening mammogram for malignant neoplasm of breast: Secondary | ICD-10-CM | POA: Diagnosis not present

## 2019-04-24 ENCOUNTER — Other Ambulatory Visit: Payer: Self-pay | Admitting: Family Medicine

## 2019-04-24 DIAGNOSIS — R928 Other abnormal and inconclusive findings on diagnostic imaging of breast: Secondary | ICD-10-CM

## 2019-04-29 ENCOUNTER — Other Ambulatory Visit: Payer: Self-pay | Admitting: Family Medicine

## 2019-04-29 ENCOUNTER — Ambulatory Visit
Admission: RE | Admit: 2019-04-29 | Discharge: 2019-04-29 | Disposition: A | Discharge status: Home / Self Care | Payer: Self-pay | Source: Ambulatory Visit | Attending: Family Medicine | Admitting: Family Medicine

## 2019-04-29 ENCOUNTER — Other Ambulatory Visit: Payer: Self-pay

## 2019-04-29 DIAGNOSIS — N6320 Unspecified lump in the left breast, unspecified quadrant: Secondary | ICD-10-CM

## 2019-04-29 DIAGNOSIS — R928 Other abnormal and inconclusive findings on diagnostic imaging of breast: Secondary | ICD-10-CM

## 2019-05-01 ENCOUNTER — Ambulatory Visit
Admission: RE | Admit: 2019-05-01 | Discharge: 2019-05-01 | Disposition: A | Discharge status: Home / Self Care | Payer: BC Managed Care – PPO | Source: Ambulatory Visit | Attending: Family Medicine | Admitting: Family Medicine

## 2019-05-01 ENCOUNTER — Other Ambulatory Visit: Payer: Self-pay

## 2019-05-01 DIAGNOSIS — N6320 Unspecified lump in the left breast, unspecified quadrant: Secondary | ICD-10-CM

## 2019-05-01 DIAGNOSIS — R921 Mammographic calcification found on diagnostic imaging of breast: Secondary | ICD-10-CM | POA: Diagnosis not present

## 2019-05-01 DIAGNOSIS — C50012 Malignant neoplasm of nipple and areola, left female breast: Secondary | ICD-10-CM | POA: Diagnosis not present

## 2019-05-01 DIAGNOSIS — N6324 Unspecified lump in the left breast, lower inner quadrant: Secondary | ICD-10-CM | POA: Diagnosis not present

## 2019-05-05 ENCOUNTER — Telehealth: Payer: Self-pay | Admitting: Hematology and Oncology

## 2019-05-05 NOTE — Telephone Encounter (Signed)
Spoke to patient to confirm morning [facility] appointment for 8/26, packet will be mailed to patient

## 2019-05-05 NOTE — Progress Notes (Signed)
FYI

## 2019-05-06 ENCOUNTER — Encounter: Payer: Self-pay | Admitting: *Deleted

## 2019-05-06 DIAGNOSIS — C50212 Malignant neoplasm of upper-inner quadrant of left female breast: Secondary | ICD-10-CM

## 2019-05-06 DIAGNOSIS — Z170 Estrogen receptor positive status [ER+]: Secondary | ICD-10-CM

## 2019-05-12 ENCOUNTER — Ambulatory Visit: Payer: BLUE CROSS/BLUE SHIELD | Admitting: Gastroenterology

## 2019-05-12 ENCOUNTER — Encounter: Payer: Self-pay | Admitting: *Deleted

## 2019-05-12 NOTE — Progress Notes (Signed)
[name] NOTE  Patient Care Team: Susy Frizzle, MD as PCP - General (Family Medicine) Mauro Kaufmann, RN as Oncology Nurse Navigator Rockwell Germany, RN as Oncology Nurse Navigator Rolm Bookbinder, MD as Consulting Physician (General Surgery) Nicholas Lose, MD as Consulting Physician (Hematology and Oncology) Kyung Rudd, MD as Consulting Physician (Radiation Oncology)  CHIEF COMPLAINTS/PURPOSE OF CONSULTATION:  Newly diagnosed breast cancer  HISTORY OF PRESENTING ILLNESS:  Victoria Richardson 58 y.o. female is here because of recent diagnosis of invasive ductal carcinoma of the left breast. The cancer was detected in a routine screening mammogram on 04/23/19 and was palpable on exam. Diagnostic mammogram and [place] on 04/29/19 showed an irregular mass at the 9 o'clock position in the left breast measuring 2.5cm, with a 0.5cm ill-defined adjacent mass and 1.0cm line of suspicious calcifications inferior and medial to the primary mass. There was no evidence of left axillary adenopathy. Biopsy on 05/01/19 showed invasive ductal carcinoma, grade 2-3, with intermediate grade DCIS, HER-2 negative by FISH, ER+ 100%, PR negative, Ki67 20%. She presents to the clinic today for initial evaluation and discussion of treatment options.   I reviewed her records extensively and collaborated the history with the patient.  SUMMARY OF ONCOLOGIC HISTORY: Oncology History  Malignant neoplasm of upper-inner quadrant of left breast in female, estrogen receptor positive ([place])  05/06/2019 Initial Diagnosis   Routine screening mammogram detected an irregular left breast mass at the 9 o'clock position, 2.5cm, with a 0.5cm ill-defined adjacent mass and 1.0cm line of suspicious calcifications inferior and medial to the primary mass. No evidence of left axillary adenopathy. Biopsy showed IDC, grade 2-3, intermediate grade DCIS, HER-2 - by FISH, ER+ 100%, PR -, Ki67 20%.     MEDICAL  HISTORY:  Past Medical History:  Diagnosis Date  . Allergy   . Diverticulitis     SURGICAL HISTORY: Past Surgical History:  Procedure Laterality Date  . COLONOSCOPY    . tubaligation      SOCIAL HISTORY: Social History   Socioeconomic History  . Marital status: Married    Spouse name: Not on file  . Number of children: Not on file  . Years of education: Not on file  . Highest education level: Not on file  Occupational History  . Not on file  Social Needs  . Financial resource strain: Not on file  . Food insecurity    Worry: Not on file    Inability: Not on file  . Transportation needs    Medical: Not on file    Non-medical: Not on file  Tobacco Use  . Smoking status: Current Every Day Smoker    Packs/day: 1.00    Years: 45.00    Pack years: 45.00    Types: Cigarettes  . Smokeless tobacco: Never Used  Substance and Sexual Activity  . Alcohol use: Yes    Alcohol/week: 0.0 standard drinks    Comment: rarely  . Drug use: No  . Sexual activity: Not Currently  Lifestyle  . Physical activity    Days per week: Not on file    Minutes per session: Not on file  . Stress: Not on file  Relationships  . Social Herbalist on phone: Not on file    Gets together: Not on file    Attends religious service: Not on file    Active member of club or organization: Not on file    Attends meetings of clubs or organizations: Not  on file    Relationship status: Not on file  . Intimate partner violence    Fear of current or ex partner: Not on file    Emotionally abused: Not on file    Physically abused: Not on file    Forced sexual activity: Not on file  Other Topics Concern  . 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   Married.    Smokes---    FAMILY HISTORY: Family History  Problem Relation Age of Onset  . Cancer Mother        Breast Cancer  . Colon polyps Mother   . Heart disease  Father 66       CAD  . Colon cancer Maternal Grandmother   . Esophageal cancer Neg Hx   . Rectal cancer Neg Hx   . Stomach cancer Neg Hx   . Pancreatic cancer Neg Hx     ALLERGIES:  has No Known Allergies.  MEDICATIONS:  Current Outpatient Medications  Medication Sig Dispense Refill  . Ascorbic Acid (VITAMIN C PO) Take by mouth.    . Multiple Vitamin (MULTIVITAMIN) capsule Take 1 capsule by mouth daily.     No current facility-administered medications for this visit.     REVIEW OF SYSTEMS:   Constitutional: Denies fevers, chills or abnormal night sweats Eyes: Denies blurriness of vision, double vision or watery eyes Ears, nose, mouth, throat, and face: Denies mucositis or sore throat Respiratory: Denies cough, dyspnea or wheezes Cardiovascular: Denies palpitation, chest discomfort or lower extremity swelling Gastrointestinal:  Denies nausea, heartburn or change in bowel habits Skin: Denies abnormal skin rashes Lymphatics: Denies new lymphadenopathy or easy bruising Neurological:Denies numbness, tingling or new weaknesses Behavioral/Psych: Mood is stable, no new changes  Breast: Palpable left breast mass All other systems were reviewed with the patient and are negative.  PHYSICAL EXAMINATION: ECOG PERFORMANCE STATUS: 1 - Symptomatic but completely ambulatory  Vitals:   05/13/19 0849  BP: (!) 145/79  Pulse: 73  Resp: 18  Temp: 98.2 F (36.8 C)  SpO2: 100%   Filed Weights   05/13/19 0849  Weight: 104 lb 11.2 oz (47.5 kg)    GENERAL:alert, no distress and comfortable SKIN: skin color, texture, turgor are normal, no rashes or significant lesions EYES: normal, conjunctiva are pink and non-injected, sclera clear OROPHARYNX:no exudate, no erythema and lips, buccal mucosa, and tongue normal  NECK: supple, thyroid normal size, non-tender, without nodularity LYMPH:  no palpable lymphadenopathy in the cervical, axillary or inguinal LUNGS: clear to auscultation and  percussion with normal breathing effort HEART: regular rate & rhythm and no murmurs and no lower extremity edema ABDOMEN:abdomen soft, non-tender and normal bowel sounds Musculoskeletal:no cyanosis of digits and no clubbing  PSYCH: alert & oriented x 3 with fluent speech NEURO: no focal motor/sensory deficits BREAST: No palpable nodules in breast. No palpable axillary or supraclavicular lymphadenopathy (exam performed in the presence of a chaperone)   LABORATORY DATA:  I have reviewed the data as listed Lab Results  Component Value Date   WBC 7.9 05/13/2019   HGB 15.8 (H) 05/13/2019   HCT 44.8 05/13/2019   MCV 94.3 05/13/2019   PLT 318 05/13/2019   Lab Results  Component Value Date   NA 144 05/13/2019   K 3.0 (LL) 05/13/2019   CL 103 05/13/2019   CO2 31 05/13/2019    RADIOGRAPHIC STUDIES: I have personally reviewed the radiological reports and agreed with the  findings in the report.  ASSESSMENT AND PLAN:  Malignant neoplasm of upper-inner quadrant of left breast in female, estrogen receptor positive ([place]) 05/06/2019: Routine screening mammogram detected an irregular left breast mass at the 9 o'clock position, 2.5cm, with a 0.5cm ill-defined adjacent mass and 1.0cm line of suspicious calcifications inferior and medial to the primary mass. No evidence of left axillary adenopathy. Biopsy showed IDC, grade 2-3, intermediate grade DCIS, HER-2 - by FISH, ER+ 100%, PR -, Ki67 20%.  Pathology and radiology counseling:Discussed with the patient, the details of pathology including the type of breast cancer,the clinical staging, the significance of ER, PR and HER-2/neu receptors and the implications for treatment. After reviewing the pathology in detail, we proceeded to discuss the different treatment options between surgery, radiation, chemotherapy, antiestrogen therapies.  Recommendations: 1.  Mastectomy with sentinel lymph node biopsy followed by 2. Oncotype DX testing to determine if  chemotherapy would be of any benefit followed by 3. Adjuvant antiestrogen therapy  Oncotype counseling: I discussed Oncotype DX test. I explained to the patient that this is a 21 gene panel to evaluate patient tumors DNA to calculate recurrence score. This would help determine whether patient has high risk or intermediate risk or low risk breast cancer. She understands that if her tumor was found to be high risk, she would benefit from systemic chemotherapy. If low risk, no need of chemotherapy. If she was found to be intermediate risk, we would need to evaluate the score as well as other risk factors and determine if an abbreviated chemotherapy may be of benefit.  Her mom Emilio Math is also a patient of mine and breast cancer survivor. Because of extensive family history of prostate cancer and her mom with breast cancer, I recommended genetic testing.  Return to clinic after surgery to discuss final pathology report and then determine if Oncotype DX testing will need to be sent. We will do a D[employer] video call to review the final pathology.    All questions were answered. The patient knows to call the clinic with any problems, questions or concerns.   Rulon Eisenmenger, MD 05/13/2019    I, Molly Dorshimer, am acting as scribe for Nicholas Lose, MD.  I have reviewed the above documentation for accuracy and completeness, and I agree with the above.

## 2019-05-13 ENCOUNTER — Ambulatory Visit
Admission: RE | Admit: 2019-05-13 | Discharge: 2019-05-13 | Disposition: A | Discharge status: Home / Self Care | Payer: BC Managed Care – PPO | Source: Ambulatory Visit | Attending: Radiation Oncology | Admitting: Radiation Oncology

## 2019-05-13 ENCOUNTER — Inpatient Hospital Stay: Payer: BC Managed Care – PPO | Attending: Hematology and Oncology | Admitting: Hematology and Oncology

## 2019-05-13 ENCOUNTER — Encounter: Payer: Self-pay | Admitting: Licensed Clinical Social Worker

## 2019-05-13 ENCOUNTER — Encounter: Payer: Self-pay | Admitting: Hematology and Oncology

## 2019-05-13 ENCOUNTER — Encounter: Payer: Self-pay | Admitting: Physical Therapy

## 2019-05-13 ENCOUNTER — Other Ambulatory Visit: Payer: Self-pay | Admitting: General Surgery

## 2019-05-13 ENCOUNTER — Ambulatory Visit: Payer: BC Managed Care – PPO | Attending: General Surgery | Admitting: Physical Therapy

## 2019-05-13 ENCOUNTER — Ambulatory Visit (HOSPITAL_BASED_OUTPATIENT_CLINIC_OR_DEPARTMENT_OTHER): Payer: BC Managed Care – PPO | Admitting: Licensed Clinical Social Worker

## 2019-05-13 ENCOUNTER — Inpatient Hospital Stay: Payer: BC Managed Care – PPO

## 2019-05-13 ENCOUNTER — Other Ambulatory Visit: Payer: Self-pay

## 2019-05-13 DIAGNOSIS — Z803 Family history of malignant neoplasm of breast: Secondary | ICD-10-CM | POA: Diagnosis not present

## 2019-05-13 DIAGNOSIS — C50312 Malignant neoplasm of lower-inner quadrant of left female breast: Secondary | ICD-10-CM

## 2019-05-13 DIAGNOSIS — C50212 Malignant neoplasm of upper-inner quadrant of left female breast: Secondary | ICD-10-CM

## 2019-05-13 DIAGNOSIS — R293 Abnormal posture: Secondary | ICD-10-CM | POA: Diagnosis not present

## 2019-05-13 DIAGNOSIS — Z170 Estrogen receptor positive status [ER+]: Secondary | ICD-10-CM

## 2019-05-13 DIAGNOSIS — Z8042 Family history of malignant neoplasm of prostate: Secondary | ICD-10-CM | POA: Diagnosis not present

## 2019-05-13 DIAGNOSIS — Z8051 Family history of malignant neoplasm of kidney: Secondary | ICD-10-CM | POA: Diagnosis not present

## 2019-05-13 DIAGNOSIS — Z800 Family history of malignant neoplasm of digestive organs: Secondary | ICD-10-CM

## 2019-05-13 LAB — CMP (CANCER CENTER ONLY)
ALT: 17 U/L (ref 0–44)
AST: 17 U/L (ref 15–41)
Albumin: 4.2 g/dL (ref 3.5–5.0)
Alkaline Phosphatase: 66 U/L (ref 38–126)
Anion gap: 10 (ref 5–15)
BUN: 8 mg/dL (ref 6–20)
CO2: 31 mmol/L (ref 22–32)
Calcium: 9.3 mg/dL (ref 8.9–10.3)
Chloride: 103 mmol/L (ref 98–111)
Creatinine: 0.66 mg/dL (ref 0.44–1.00)
GFR, Est AFR Am: >60 mL/min (ref >60)
GFR, Estimated: >60 mL/min (ref >60)
Glucose, Bld: 100 mg/dL — ABNORMAL HIGH (ref 70–99)
Potassium: 3 mmol/L — CL (ref 3.5–5.1)
Sodium: 144 mmol/L (ref 135–145)
Total Bilirubin: 0.9 mg/dL (ref 0.3–1.2)
Total Protein: 7.2 g/dL (ref 6.5–8.1)

## 2019-05-13 LAB — CBC WITH DIFFERENTIAL (CANCER CENTER ONLY)
Abs Immature Granulocytes: 0.02 10*3/uL (ref 0.00–0.07)
Basophils Absolute: 0.1 10*3/uL (ref 0.0–0.1)
Basophils Relative: 1 %
Eosinophils Absolute: 0.4 10*3/uL (ref 0.0–0.5)
Eosinophils Relative: 5 %
HCT: 44.8 % (ref 36.0–46.0)
Hemoglobin: 15.8 g/dL — ABNORMAL HIGH (ref 12.0–15.0)
Immature Granulocytes: 0 %
Lymphocytes Relative: 23 %
Lymphs Abs: 1.8 10*3/uL (ref 0.7–4.0)
MCH: 33.3 pg (ref 26.0–34.0)
MCHC: 35.3 g/dL (ref 30.0–36.0)
MCV: 94.3 fL (ref 80.0–100.0)
Monocytes Absolute: 0.5 10*3/uL (ref 0.1–1.0)
Monocytes Relative: 7 %
Neutro Abs: 5.1 10*3/uL (ref 1.7–7.7)
Neutrophils Relative %: 64 %
Platelet Count: 318 10*3/uL (ref 150–400)
RBC: 4.75 MIL/uL (ref 3.87–5.11)
RDW: 11.8 % (ref 11.5–15.5)
WBC Count: 7.9 10*3/uL (ref 4.0–10.5)
nRBC: 0 % (ref 0.0–0.2)

## 2019-05-13 NOTE — Assessment & Plan Note (Signed)
05/06/2019: Routine screening mammogram detected an irregular left breast mass at the 9 o'clock position, 2.5cm, with a 0.5cm ill-defined adjacent mass and 1.0cm line of suspicious calcifications inferior and medial to the primary mass. No evidence of left axillary adenopathy. Biopsy showed IDC, grade 2-3, intermediate grade DCIS, HER-2 - by FISH, ER+ 100%, PR -, Ki67 20%.  Pathology and radiology counseling:Discussed with the patient, the details of pathology including the type of breast cancer,the clinical staging, the significance of ER, PR and HER-2/neu receptors and the implications for treatment. After reviewing the pathology in detail, we proceeded to discuss the different treatment options between surgery, radiation, chemotherapy, antiestrogen therapies.  Recommendations: 1.  Mastectomy with sentinel lymph node biopsy followed by 2. Oncotype DX testing to determine if chemotherapy would be of any benefit followed by 3. Adjuvant antiestrogen therapy  Oncotype counseling: I discussed Oncotype DX test. I explained to the patient that this is a 21 gene panel to evaluate patient tumors DNA to calculate recurrence score. This would help determine whether patient has high risk or intermediate risk or low risk breast cancer. She understands that if her tumor was found to be high risk, she would benefit from systemic chemotherapy. If low risk, no need of chemotherapy. If she was found to be intermediate risk, we would need to evaluate the score as well as other risk factors and determine if an abbreviated chemotherapy may be of benefit.  Return to clinic after surgery to discuss final pathology report and then determine if Oncotype DX testing will need to be sent.

## 2019-05-13 NOTE — Patient Instructions (Signed)

## 2019-05-13 NOTE — Therapy (Signed)
[place], [place], [address] Phone: [phone]   Fax:  [phone]  Physical Therapy Evaluation  Patient Details  Name: Victoria Richardson MRN: [id number] Date of Birth: December 07, 1960 Referring Provider (PT): Dr. Rolm Bookbinder   Encounter Date: 05/13/2019  PT End of Session - 05/13/19 1013    Visit Number  1    Number of Visits  2    Date for PT Re-Evaluation  07/08/19    PT Start Time  0941    PT Stop Time  1005    PT Time Calculation (min)  24 min    Activity Tolerance  Patient tolerated treatment well    Behavior During Therapy  [facility] for tasks assessed/performed       Past Medical History:  Diagnosis Date  . Allergy   . Diverticulitis     Past Surgical History:  Procedure Laterality Date  . COLONOSCOPY    . tubaligation      There were no vitals filed for this visit.   Subjective Assessment - 05/13/19 1007    Subjective  Patient reports she is here today to be seen by her medical team for her newly diagnosed left breast cancer.    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 There are 2 masses measuring 2.5 cm and 5 mm in the lower inner quadrant with 4.8 cm of calcifications. It is ER positive, PR negative, and HER2 negative with a Ki67 of 20%. She smokes 1 pack per day.    Patient Stated Goals  Reduce lymphedema risk and learn post op shoulder ROM HEP    Currently in Pain?  No/denies         [facility] PT Assessment - 05/13/19 0001      Assessment   Medical Diagnosis  Left breast cancer    Referring Provider (PT)  Dr. Rolm Bookbinder    Onset Date/Surgical Date  04/23/19    Hand Dominance  Right    Prior Therapy  none      Precautions   Precautions  Other (comment)    Precaution Comments  active cancer      Restrictions   Weight Bearing Restrictions  No      Balance Screen   Has the patient  fallen in the past 6 months  No    Has the patient had a decrease in activity level because of a fear of falling?   No    Is the patient reluctant to leave their home because of a fear of falling?   No      Home Environment   Living Environment  Private residence    Living Arrangements  Spouse/significant other    Available Help at Discharge  Family      Prior Function   Level of Independence  Independent    Vocation  Full time employment    Vocation Requirements  Works at [employer] at [employer] - very physical work    Leisure  She does not exercise      Cognition   Overall Cognitive Status  Within Functional Limits for tasks assessed      Posture/Postural Control   Posture/Postural Control  Postural limitations    Postural Limitations  Rounded Shoulders;Forward head;Increased thoracic kyphosis      ROM / Strength   AROM / PROM / Strength  AROM;Strength      AROM   Overall AROM Comments  Cervical AROM is WNL    AROM Assessment Site  Shoulder    Right/Left Shoulder  Right;Left    Right Shoulder Extension  50 Degrees    Right Shoulder Flexion  159 Degrees    Right Shoulder ABduction  171 Degrees    Right Shoulder Internal Rotation  62 Degrees    Right Shoulder External Rotation  86 Degrees    Left Shoulder Extension  46 Degrees    Left Shoulder Flexion  149 Degrees    Left Shoulder ABduction  167 Degrees    Left Shoulder Internal Rotation  62 Degrees    Left Shoulder External Rotation  90 Degrees      Strength   Overall Strength  Within functional limits for tasks performed        LYMPHEDEMA/ONCOLOGY QUESTIONNAIRE - 05/13/19 1012      Type   Cancer Type  Left breast cancer      Lymphedema Assessments   Lymphedema Assessments  Upper extremities      Right Upper Extremity Lymphedema   10 cm Proximal to Olecranon Process  21.4 cm    Olecranon Process  21.3 cm    10 cm Proximal to Ulnar Styloid Process  18.6 cm    Just Proximal to Ulnar Styloid Process  13.8 cm     Across Hand at [employer]  18 cm    At [place] of 2nd Digit  6.2 cm      Left Upper Extremity Lymphedema   10 cm Proximal to Olecranon Process  21.6 cm    Olecranon Process  21.2 cm    10 cm Proximal to Ulnar Styloid Process  18.2 cm    Just Proximal to Ulnar Styloid Process  13.8 cm    Across Hand at [employer]  17.4 cm    At [place] of 2nd Digit  6 cm          Quick Dash - 05/13/19 0001    Open a tight or new jar  Mild difficulty    Do heavy household chores (wash walls, wash floors)  No difficulty    Carry a shopping bag or briefcase  No difficulty    Wash your back  No difficulty    Use a knife to cut food  No difficulty    Recreational activities in which you take some force or impact through your arm, shoulder, or hand (golf, hammering, tennis)  No difficulty    During the past week, to what extent has your arm, shoulder or hand problem interfered with your normal social activities with family, friends, neighbors, or groups?  Not at all    During the past week, to what extent has your arm, shoulder or hand problem limited your work or other regular daily activities  Not at all    Arm, shoulder, or hand pain.  None    Tingling (pins and needles) in your arm, shoulder, or hand  None    Difficulty Sleeping  No difficulty    DASH Score  2.27 %        Objective measurements completed on examination: See above findings.      Patient was instructed today in a home exercise program today for post op shoulder range of motion. These included active assist shoulder flexion in sitting, scapular retraction, wall walking with shoulder abduction, and hands behind head external rotation.  She was encouraged to do these twice a day, holding 3 seconds and repeating 5 times when  permitted by her physician.           PT Education - 05/13/19 1013    Education Details  Lymphedema risk reduction and post op shoulder ROM HEP    Person(s) Educated  Patient    Methods   Explanation;Demonstration;Handout    Comprehension  Returned demonstration;Verbalized understanding          PT Long Term Goals - 05/13/19 1021      PT LONG TERM GOAL #1   Title  Patient will demonstrate she has regained full shoulder ROM and function post operatively compared to baselines.    Time  8    Period  Weeks    Status  New      Breast Clinic Goals - 05/13/19 1021      Patient will be able to verbalize understanding of pertinent lymphedema risk reduction practices relevant to her diagnosis specifically related to skin care.   Time  1    Period  Days    Status  Achieved      Patient will be able to return demonstrate and/or verbalize understanding of the post-op home exercise program related to regaining shoulder range of motion.   Time  1    Period  Days    Status  Achieved      Patient will be able to verbalize understanding of the importance of attending the postoperative After Breast Cancer Class for further lymphedema risk reduction education and therapeutic exercise.   Time  1    Period  Days    Status  Achieved            Plan - 05/13/19 1014    Clinical Impression Statement  Patient was diagnosed on 04/23/2019 with left grade II-III invasive ductal carcinoma breast cancer. There are 2 masses measuring 2.5 cm and 5 mm in the lower inner quadrant with 4.8 cm of calcifications. It is ER positive, PR negative, and HER2 negative with a Ki67 of 20%. She smokes 1 pack per day. Her multidisciplinary medical team met prior to her assessments to determine a recommended treatment plan. She is planning to have a left mastectomy and sentinel node biopsy followed by possible radiation and anti-estrogen therapy. She will benefit from a post op PT visit to reassess and determine needs.    Stability/Clinical Decision Making  Stable/Uncomplicated    Clinical Decision Making  Low    Rehab Potential  Excellent    PT Frequency  --   Eval and 1 f/u visit   PT  Treatment/Interventions  ADLs/Self Care Home Management;Therapeutic exercise;Patient/family education    PT Next Visit Plan  Will reassess 3-4 weeks post op to determine needs    PT Home Exercise Plan  Post op shoulder ROM HEP    Consulted and Agree with Plan of Care  Patient       Patient will benefit from skilled therapeutic intervention in order to improve the following deficits and impairments:  Postural dysfunction, Decreased range of motion, Pain, Impaired UE functional use, Decreased knowledge of precautions  Visit Diagnosis: Malignant neoplasm of lower-inner quadrant of left breast in female, estrogen receptor positive ([place]) - Plan: PT plan of care cert/re-cert  Abnormal posture - Plan: PT plan of care cert/re-cert   Patient will follow up at outpatient cancer rehab 3-4 weeks following surgery.  If the patient requires physical therapy at that time, a specific plan will be dictated and sent to the referring physician for approval. The patient was educated today on  appropriate basic range of motion exercises to begin post operatively and the importance of attending the After Breast Cancer class following surgery.  Patient was educated today on lymphedema risk reduction practices as it pertains to recommendations that will benefit the patient immediately following surgery.  She verbalized good understanding.     Problem List Patient Active Problem List   Diagnosis Date Noted  . Malignant neoplasm of upper-inner quadrant of left breast in female, estrogen receptor positive ([place]) 05/06/2019  . Family history of breast cancer in mother 04/02/2019  . Weight loss 04/02/2019  . Abnormal TSH 04/02/2019  . Diverticulitis 04/02/2019  . Aortic atherosclerosis ([place]) 04/02/2019  . Smoker 01/30/2016  . Encounter for smoking cessation counseling 01/30/2016  . HYPERLIPIDEMIA 12/15/2007  . CERVICAL POLYP 12/15/2007  . RECTAL BLEEDING, HX OF 12/15/2007   Annia Friendly, PT 05/13/19 10:33  AM  [place], [place], [address] Phone: [phone]   Fax:  [phone]  Name: Victoria Richardson MRN: [id number] Date of Birth: 11-28-1960

## 2019-05-13 NOTE — Progress Notes (Signed)
REFERRING PROVIDER: Nicholas Lose, MD [place],  [place] [id number]  PRIMARY PROVIDER:  Susy Frizzle, MD  PRIMARY REASON FOR VISIT:  1. Malignant neoplasm of upper-inner quadrant of left breast in female, estrogen receptor positive ([place])   2. Family history of breast cancer   3. Family history of colon cancer   4. Family history of prostate cancer   5. Family history of kidney cancer     I connected with Ms. Rings on 05/13/2019 at 10:50 AM EDT by Jackquline Denmark and verified that I am speaking with the correct person using two identifiers.    Patient location: [facility] Provider location: clinic  HISTORY OF PRESENT ILLNESS:   Ms. Coto, a 58 y.o. female, was seen for a [place] cancer genetics consultation at the request of Dr. Lindi Adie due to a personal and family history of cancer.  Ms. Moro presents to clinic today to discuss the possibility of a hereditary predisposition to cancer, genetic testing, and to further clarify her future cancer risks, as well as potential cancer risks for family members.   In 2020, at the age of 66, Ms. Leavy was diagnosed with IDC of the left breast, ER+, PR-, Her2-. The treatment plan includes mastectomy, possible chemotherapy and antiestrogen therapy.Marland Kitchen    CANCER HISTORY:  Oncology History  Malignant neoplasm of upper-inner quadrant of left breast in female, estrogen receptor positive ([place])  05/06/2019 Initial Diagnosis   Routine screening mammogram detected an irregular left breast mass at the 9 o'clock position, 2.5cm, with a 0.5cm ill-defined adjacent mass and 1.0cm line of suspicious calcifications inferior and medial to the primary mass. No evidence of left axillary adenopathy. Biopsy showed IDC, grade 2-3, intermediate grade DCIS, HER-2 - by FISH, ER+ 100%, PR -, Ki67 20%.      RISK FACTORS:  Menarche was at age 36.  OCP use: yes Ovaries intact: yes.  Hysterectomy: no.   Past Medical History:  Diagnosis Date  .  Allergy   . Diverticulitis   . Family history of breast cancer   . Family history of colon cancer   . Family history of kidney cancer   . Family history of prostate cancer     Past Surgical History:  Procedure Laterality Date  . COLONOSCOPY    . tubaligation      Social History   Socioeconomic History  . Marital status: Married    Spouse name: Not on file  . Number of children: Not on file  . Years of education: Not on file  . Highest education level: Not on file  Occupational History  . Not on file  Social Needs  . Financial resource strain: Not on file  . Food insecurity    Worry: Not on file    Inability: Not on file  . Transportation needs    Medical: Not on file    Non-medical: Not on file  Tobacco Use  . Smoking status: Current Every Day Smoker    Packs/day: 1.00    Years: 45.00    Pack years: 45.00    Types: Cigarettes  . Smokeless tobacco: Never Used  Substance and Sexual Activity  . Alcohol use: Yes    Alcohol/week: 0.0 standard drinks    Comment: rarely  . Drug use: No  . Sexual activity: Not Currently  Lifestyle  . Physical activity    Days per week: Not on file    Minutes per session: Not on file  . Stress: Not on file  Relationships  . Social Herbalist on phone: Not on file    Gets together: Not on file    Attends religious service: Not on file    Active member of club or organization: Not on file    Attends meetings of clubs or organizations: Not on file    Relationship status: Not on file  Other Topics Concern  . 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   Married.    Smokes---     FAMILY HISTORY:  We obtained a detailed, 4-generation family history.  Significant diagnoses are listed below: Family History  Problem Relation Age of Onset  . Cancer Mother 11       Breast Cancer  . Colon polyps Mother   . Heart disease Father 80       CAD  . Colon  cancer Maternal Grandmother        dx 30s  . Kidney cancer Maternal Aunt   . Prostate cancer Maternal Uncle   . Cancer Maternal Uncle        unk type  . Breast cancer Cousin        dx early 32s  . Hodgkin's lymphoma Cousin   . Esophageal cancer Neg Hx   . Rectal cancer Neg Hx   . Stomach cancer Neg Hx   . Pancreatic cancer Neg Hx    Ms. Kempen has a son and a daughter, no cancers. She has one brother, no history of cancer. She has 2 nieces. One of her nieces is a Marine scientist and recommended she have genetic testing.  Ms. Fink mother had breast cancer at 9 and is living at 54. Patient has 3 maternal aunts, 3 maternal uncles. One of her aunts died from kidney cancer. An uncle died from prostate cancer. Another uncle had cancer but she is unsure of the type. Another uncle had skin cancer and is still living. She also has a maternal cousin who had breast cancer diagnosed in her early 18s. Another maternal cousin had Hodgkin's lymphoma. Patient's maternal grandmother had colon cancer in her 21s, unsure age of death. Maternal grandfather died young due to drowning.   Ms. Osoria father died at 77. Patient has 2 paternal aunts, 2 paternal uncles, no cancers. No cancers in paternal cousins. She is unsure how old her paternal grandparents were when they passed.  Ms. Pastrana is unaware of previous family history of genetic testing for hereditary cancer risks. There is no reported Ashkenazi Jewish ancestry. There is no known consanguinity.  GENETIC COUNSELING ASSESSMENT: Ms. Puffenbarger is a 58 y.o. female with a personal and family which is somewhat suggestive of a hereditary cancer syndrome and predisposition to cancer. We, therefore, discussed and recommended the following at today's visit.   DISCUSSION: We discussed that 5 - 10% of breast cancer is hereditary, with most cases associated with BRCA1/BRCA2 mutations.  There are other genes that can be associated with hereditary breast cancer syndromes. We  discussed that testing is beneficial for several reasons including surgical decision-making for breast cancer, knowing how to follow individuals after completing their treatment, and understand if other family members could be at risk for cancer and allow them to undergo genetic testing.   We reviewed the characteristics, features and inheritance patterns of hereditary cancer syndromes. We also discussed genetic testing, including the appropriate family members to test, the process of testing, insurance coverage and turn-around-time for results. We  discussed the implications of a negative, positive and/or variant of uncertain significant result. In order to get genetic test results in a timely manner so that Ms. Artiaga can use these genetic test results for surgical decisions, we recommended Ms. Buendia pursue genetic testing for the Breast Cancer STAT Panel. Once complete, we recommend Ms. Gougeon pursue reflex genetic testing to the Common Hereditary Cancers gene panel.   The STAT Breast cancer panel offered by [facility] includes sequencing and rearrangement analysis for the following 9 genes:  ATM, BRCA1, BRCA2, CDH1, CHEK2, PALB2, PTEN, STK11 and TP53.    The Common Hereditary Cancers Panel offered by [facility] includes sequencing and/or deletion duplication testing of the following 48 genes: APC, ATM, AXIN2, BARD1, BMPR1A, BRCA1, BRCA2, BRIP1, CDH1, CDKN2A (p14ARF), CDKN2A (p16INK4a), CKD4, CHEK2, CTNNA1, DICER1, EPCAM (Deletion/duplication testing only), GREM1 (promoter region deletion/duplication testing only), KIT, MEN1, MLH1, MSH2, MSH3, MSH6, MUTYH, NBN, NF1, NHTL1, PALB2, PDGFRA, PMS2, POLD1, POLE, PTEN, RAD50, RAD51C, RAD51D, RNF43, SDHB, SDHC, SDHD, SMAD4, SMARCA4. STK11, TP53, TSC1, TSC2, and VHL.  The following genes were evaluated for sequence changes only: SDHA and HOXB13 c.251G>A variant only.  Based on Ms. Shugart's personal and family history of cancer, she meets medical criteria for genetic  testing. Despite that she meets criteria, she may still have an out of pocket cost.   PLAN: After considering the risks, benefits, and limitations, Ms. Silvey provided informed consent to pursue genetic testing and the blood sample was sent to [facility] for analysis of the Breast Cancer STAT panel + Common Hereditary Cancers Panel. Results should be available within approximately 5-12 days' time, at which point they will be disclosed by telephone to Ms. Holroyd, as will any additional recommendations warranted by these results. Ms. Eastwood will receive a summary of her genetic counseling visit and a copy of her results once available. This information will also be available in Epic.    Ms. Wolpert questions were answered to her satisfaction today. Our contact information was provided should additional questions or concerns arise. Thank you for the referral and allowing [place] to share in the care of your patient.   Faith Rogue, MS, [facility] Genetic Counselor [email or web address] Phone: [phone]  The patient was seen for a total of 15 minutes in virtual  genetic counseling.  Drs. Magrinat, Lindi Adie and/or Burr Medico were available for discussion regarding this case.   _______________________________________________________________________ For Office Staff:  Number of people involved in session: 1 Was an Intern/ student involved with case: no

## 2019-05-13 NOTE — Progress Notes (Signed)
Radiation Oncology         [phone]) [phone] ________________________________  Name: Victoria Richardson        MRN: [id number]  Date of Service: 05/13/2019 DOB: 10/26/1960  MC:[id number], Cammie Mcgee, MD  Rolm Bookbinder, MD     REFERRING PHYSICIAN: Rolm Bookbinder, MD   DIAGNOSIS: The encounter diagnosis was Malignant neoplasm of upper-inner quadrant of left breast in female, estrogen receptor positive ([place]).   HISTORY OF PRESENT ILLNESS: Victoria Richardson is a 58 y.o. female seen in the multidisciplinary breast clinic for a new diagnosis of left breast cancer. The patient was noted to have a screening detected abnormality in the left breast.  She subsequently underwent diagnostic imaging which revealed a mass in the left lower inner quadrant at 9:00 measuring 2.5 x 2 x 1.3 cm.  There was also a satellite lesion that measured 5 x 4 x 3 6 mm.  Her axilla was negative for adenopathy, and there were associated calcifications that were not biopsied that spanned up to 4.8 cm.  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  Her tumor was ER positive, PR negative, HER-2 negative and her Ki-67 was 20%.  She comes today to discuss options of treatment for her cancer.    PREVIOUS RADIATION THERAPY: No   PAST MEDICAL HISTORY:  Past Medical History:  Diagnosis Date   Allergy    Diverticulitis        PAST SURGICAL HISTORY: Past Surgical History:  Procedure Laterality Date   COLONOSCOPY     tubaligation       FAMILY HISTORY:  Family History  Problem Relation Age of Onset   Cancer Mother        Breast Cancer   Colon polyps Mother    Heart disease Father 17       CAD   Colon cancer Maternal Grandmother    Esophageal cancer Neg Hx    Rectal cancer Neg Hx    Stomach cancer Neg Hx    Pancreatic cancer Neg Hx        SOCIAL HISTORY:  reports that she has been smoking cigarettes. She has a 45.00 pack-year smoking history. She has never used smokeless tobacco. She reports current alcohol use. She reports that she does not use drugs.  The patient is married and lives in [place].  She works at [employer].   ALLERGIES: Patient has no known allergies.   MEDICATIONS:  Current Outpatient Medications  Medication Sig Dispense Refill   Ascorbic Acid (VITAMIN C PO) Take by mouth.     Multiple Vitamin (MULTIVITAMIN) capsule Take 1 capsule by mouth daily.     No current facility-administered medications for this encounter.      REVIEW OF SYSTEMS: On review of systems, the patient reports that she is doing well overall. She denies any chest pain, shortness of breath, cough, fevers, chills, night sweats, unintended weight changes. She denies any bowel or bladder disturbances, and denies abdominal pain, nausea or vomiting. She denies any new musculoskeletal or joint aches or pains. A complete review of systems is obtained and is otherwise negative.     PHYSICAL EXAM:  Wt Readings from Last 3 Encounters:  05/13/19 104 lb 11.2 oz (47.5 kg)  04/02/19 104 lb (47.2 kg)  04/16/18 110 lb (49.9 kg)   Temp Readings from Last 3 Encounters:  05/13/19 98.2 F (36.8 C) (Oral)  04/02/19 98.6 F (37 C) (Oral)  04/16/18 98.3 F (36.8 C) (Oral)   BP Readings from Last 3 Encounters:  05/13/19 (!) 145/79  04/02/19 128/68  04/16/18 134/86   Pulse Readings from Last 3 Encounters:  05/13/19 73  04/02/19 92  04/16/18 76    In general this is a well appearing Caucasian female in no acute distress.  She's alert and oriented x4 and appropriate throughout the examination. Cardiopulmonary assessment is negative for acute distress and she exhibits normal effort.  Breast exam is deferred   ECOG = 0  0 - Asymptomatic (Fully active, able to carry on all predisease activities without restriction)  1 - Symptomatic  but completely ambulatory (Restricted in physically strenuous activity but ambulatory and able to carry out work of a light or sedentary nature. For example, light housework, office work)  2 - Symptomatic, <50% in bed during the day (Ambulatory and capable of all self care but unable to carry out any work activities. Up and about more than 50% of waking hours)  3 - Symptomatic, >50% in bed, but not bedbound (Capable of only limited self-care, confined to bed or chair 50% or more of waking hours)  4 - Bedbound (Completely disabled. Cannot carry on any self-care. Totally confined to bed or chair)  5 - Death   Eustace Pen MM, Creech RH, Tormey DC, et al. [phone]). "Toxicity and response criteria of the [facility]". [place] Oncol. 5 (6): 649-55    LABORATORY DATA:  Lab Results  Component Value Date   WBC 7.9 05/13/2019   HGB 15.8 (H) 05/13/2019   HCT 44.8 05/13/2019   MCV 94.3 05/13/2019   PLT 318 05/13/2019   Lab Results  Component Value Date   NA 144 05/13/2019   K 3.0 (LL) 05/13/2019   CL 103 05/13/2019   CO2 31 05/13/2019   Lab Results  Component Value Date   ALT 17 05/13/2019   AST 17 05/13/2019   ALKPHOS 66 05/13/2019   BILITOT 0.9 05/13/2019      RADIOGRAPHY: Mm Digital Screening  Result Date: 04/23/2019 CLINICAL DATA:  Screening. EXAM: DIGITAL SCREENING BILATERAL MAMMOGRAM WITH CAD COMPARISON:  Previous exam(s). ACR Breast Density Category c: The breast tissue is heterogeneously dense, which may obscure small masses. FINDINGS: In the left breast, a possible mass warrants further evaluation. In the right breast, no findings suspicious for malignancy. Images were processed with CAD. IMPRESSION: Further evaluation is suggested for possible mass in the left breast. RECOMMENDATION: Diagnostic mammogram and possibly ultrasound of the left breast. (Code:[id number]-[id number]) The patient will be contacted regarding the findings, and additional imaging will be scheduled.  BI-RADS CATEGORY  0: Incomplete. Need additional imaging evaluation and/or prior mammograms for comparison. Electronically Signed   By: Abelardo Diesel M.D.   On: 04/23/2019 11:41   US Breast Ltd Uni Left Inc Axilla  Result Date: 04/29/2019 CLINICAL DATA:  Screening recall for a possible left breast mass. EXAM: DIGITAL DIAGNOSTIC LEFT MAMMOGRAM WITH CAD AND TOMO ULTRASOUND LEFT BREAST COMPARISON:  Previous exam(s). ACR Breast Density Category c: The breast tissue is heterogeneously dense, which may obscure small masses. FINDINGS: The possible mass in the medial left breast persists on diagnostic imaging. It measures approximately 1.8 cm. It is irregular with associated architectural distortion and calcifications. The small satellite mass seen on the screening study, posterior to the larger mass, is not defined on the diagnostic images. There is a line somewhat  pleomorphic calcifications that lies inferior and medial to the mass, spanning 1 cm. Mammographic images were processed with CAD. On physical exam, there is a firm palpable lump in the medial left breast. Targeted ultrasound is performed, showing a hypoechoic irregular mass in the left breast at 9 o'clock, 2 cm the nipple, spanning 2.5 x 1.3 x 2.0 cm. It shows significant internal blood flow on color Doppler analysis. Adjacent to this mass, at 9 o'clock, 3 cm the nipple, there is a small hypoechoic mass with partly ill-defined margins measuring 5 x 3 x 4 mm. Sonographic evaluation of the left axilla shows no enlarged or abnormal lymph nodes. IMPRESSION: 1. 2.5 cm 9 o'clock position left breast mass highly suspicious for breast carcinoma. 2. Adjacent 5 mm hypoechoic mass may reflect a satellite malignancy or a benign lesion. 3. 1 cm line suspicious calcifications lies inferior and medial to the 2.5 cm mass. These calcifications lie within 2 cm of the center of the mass. RECOMMENDATION: 1. Ultrasound-guided core needle biopsy of the 2.5 cm mass at 9 o'clock in  the left breast as well as the 5 mm adjacent satellite mass. 2. The 1 cm line of suspicious calcifications adjacent to the 2.5 cm mass may also warrant biopsy, with stereotactic guidance, if this would alter surgical management presuming the larger mass is breast carcinoma. I have discussed the findings and recommendations with the patient. Results were also provided in writing at the conclusion of the visit. If applicable, a reminder letter will be sent to the patient regarding the next appointment. BI-RADS CATEGORY  5: Highly suggestive of malignancy. Electronically Signed   By: Lajean Manes M.D.   On: 04/29/2019 12:10   Mm Diag Breast Tomo Uni Left  Result Date: 04/29/2019 CLINICAL DATA:  Screening recall for a possible left breast mass. EXAM: DIGITAL DIAGNOSTIC LEFT MAMMOGRAM WITH CAD AND TOMO ULTRASOUND LEFT BREAST COMPARISON:  Previous exam(s). ACR Breast Density Category c: The breast tissue is heterogeneously dense, which may obscure small masses. FINDINGS: The possible mass in the medial left breast persists on diagnostic imaging. It measures approximately 1.8 cm. It is irregular with associated architectural distortion and calcifications. The small satellite mass seen on the screening study, posterior to the larger mass, is not defined on the diagnostic images. There is a line somewhat pleomorphic calcifications that lies inferior and medial to the mass, spanning 1 cm. Mammographic images were processed with CAD. On physical exam, there is a firm palpable lump in the medial left breast. Targeted ultrasound is performed, showing a hypoechoic irregular mass in the left breast at 9 o'clock, 2 cm the nipple, spanning 2.5 x 1.3 x 2.0 cm. It shows significant internal blood flow on color Doppler analysis. Adjacent to this mass, at 9 o'clock, 3 cm the nipple, there is a small hypoechoic mass with partly ill-defined margins measuring 5 x 3 x 4 mm. Sonographic evaluation of the left axilla shows no enlarged or  abnormal lymph nodes. IMPRESSION: 1. 2.5 cm 9 o'clock position left breast mass highly suspicious for breast carcinoma. 2. Adjacent 5 mm hypoechoic mass may reflect a satellite malignancy or a benign lesion. 3. 1 cm line suspicious calcifications lies inferior and medial to the 2.5 cm mass. These calcifications lie within 2 cm of the center of the mass. RECOMMENDATION: 1. Ultrasound-guided core needle biopsy of the 2.5 cm mass at 9 o'clock in the left breast as well as the 5 mm adjacent satellite mass. 2. The 1 cm line of suspicious  calcifications adjacent to the 2.5 cm mass may also warrant biopsy, with stereotactic guidance, if this would alter surgical management presuming the larger mass is breast carcinoma. I have discussed the findings and recommendations with the patient. Results were also provided in writing at the conclusion of the visit. If applicable, a reminder letter will be sent to the patient regarding the next appointment. BI-RADS CATEGORY  5: Highly suggestive of malignancy. Electronically Signed   By: Lajean Manes M.D.   On: 04/29/2019 12:10   Mm Clip Placement Left  Result Date: 05/01/2019 CLINICAL DATA:  Post biopsy mammogram of the left breast for clip placement. In addition extra magnified images were obtained for further characterization of the left breast calcifications. EXAM: DIAGNOSTIC LEFT MAMMOGRAM POST ULTRASOUND BIOPSY COMPARISON:  Previous exam(s). FINDINGS: The spot compression magnification images of the medial left breast demonstrate that there is a segmental area of amorphous and punctate calcifications spanning at least 4.8 cm. There are loosely scattered calcifications seen in the more superior left breast which were been seen on the 2016 mammogram. Mammographic images were obtained following ultrasound guided biopsy of 2 left masses. The ribbon shaped biopsy marking clip is well positioned within the larger mass the left breast at 9 o'clock. The coil shaped biopsy marking  clip is seen more distant from the nipple corresponding with the mass at 9 o'clock, 3 cm from the nipple. IMPRESSION: 1. Magnification images of the medial left breast demonstrates a span of suspicious segmental calcifications spanning approximately 4.8 cm. 2. Appropriate positioning of the ribbon shaped biopsy marking clip within the mass located at 9 o'clock, 2 cm from the nipple. 3. Appropriate positioning of the coil shaped biopsy marking clip at the site of the mass at 9 o'clock, 3 cm from the nipple. Final Assessment: Post Procedure Mammograms for Marker Placement Electronically Signed   By: Ammie Ferrier M.D.   On: 05/01/2019 13:53   [place] Lt Breast Bx W Loc Dev 1st Lesion Img Bx Spec US Guide  Addendum Date: 05/05/2019   ADDENDUM REPORT: 05/05/2019 08:56 ADDENDUM: Pathology revealed 1- GRADE II-III INVASIVE DUCTAL CARCINOMA, INTERMEDIATE GRADE DUCTAL CARCINOMA IN SITU, of the LEFT breast, 9 o'clock, 2 cm from nipple. SEE NOTE. This was found to be concordant by Dr. Ammie Ferrier. Pathology revealed 2- GRADE II-III INVASIVE DUCTAL CARCINOMA, of the LEFT breast, 9 o'clock, 3 cm from nipple. SEE NOTE. This was found to be concordant by Dr. Ammie Ferrier. Diagnosis Note- 1. and 2. Carcinoma in part 1 measures 13 mm in greatest linear dimension and carcinoma in part 2 measures 2.5 mm in greatest linear dimension. Pathology results were discussed with the patient by telephone. The patient reported doing well after the biopsy with tenderness at the site. Post biopsy instructions and care were reviewed and questions were answered. The patient was encouraged to call [facility] for any additional concerns. The patient was referred to [facility] on May 13, 2019. Pathology results reported by Stacie Acres, RN on 05/05/2019. Electronically Signed   By: Ammie Ferrier M.D.   On: 05/05/2019 08:56    Result Date: 05/05/2019 CLINICAL DATA:  58 year old female presenting for ultrasound-guided biopsy of 2 left breast masses. EXAM: ULTRASOUND GUIDED LEFT BREAST CORE NEEDLE BIOPSY COMPARISON:  Previous exam(s). FINDINGS: I met with the patient and we discussed the procedure of ultrasound-guided biopsy, including benefits and alternatives. We discussed the high likelihood of a successful  procedure. We discussed the risks of the procedure, including infection, bleeding, tissue injury, clip migration, and inadequate sampling. Informed written consent was given. The usual time-out protocol was performed immediately prior to the procedure. #1 Lesion quadrant: Lower-inner quadrant Using sterile technique and 1% Lidocaine as local anesthetic, under direct ultrasound visualization, a 14 gauge spring-loaded device was used to perform biopsy of a mass in the left breast at 9 o'clock, 2 cm from the nipple using an inferior approach. At the conclusion of the procedure a ribbon shaped tissue marker clip was deployed into the biopsy cavity. -------------------------------------------------------------------------------------------------------------------------------------------- #2 Lesion quadrant: Lower-inner quadrant Using sterile technique and 1% Lidocaine as local anesthetic, under direct ultrasound visualization, a 14 gauge spring-loaded device was used to perform biopsy of a small mass in the left breast at 9 o'clock, 3 cm from the nipple using an inferior approach. At the conclusion of the procedure a coil shaped tissue marker clip was deployed into the biopsy cavity. Follow up 2 view mammogram was performed and dictated separately. IMPRESSION: 1. Ultrasound guided biopsy of a left breast mass at 9 o'clock, 2 cm from the nipple. No apparent complications. 2. Ultrasound guided biopsy of a left breast mass at 9 o'clock, 3 cm from the nipple. No apparent complications. Electronically Signed: By: Ammie Ferrier M.D. On:  05/01/2019 13:34   [place] Lt Breast Bx W Loc Dev Ea Add Lesion Img Bx Spec US Guide  Addendum Date: 05/05/2019   ADDENDUM REPORT: 05/05/2019 08:56 ADDENDUM: Pathology revealed 1- GRADE II-III INVASIVE DUCTAL CARCINOMA, INTERMEDIATE GRADE DUCTAL CARCINOMA IN SITU, of the LEFT breast, 9 o'clock, 2 cm from nipple. SEE NOTE. This was found to be concordant by Dr. Ammie Ferrier. Pathology revealed 2- GRADE II-III INVASIVE DUCTAL CARCINOMA, of the LEFT breast, 9 o'clock, 3 cm from nipple. SEE NOTE. This was found to be concordant by Dr. Ammie Ferrier. Diagnosis Note- 1. and 2. Carcinoma in part 1 measures 13 mm in greatest linear dimension and carcinoma in part 2 measures 2.5 mm in greatest linear dimension. Pathology results were discussed with the patient by telephone. The patient reported doing well after the biopsy with tenderness at the site. Post biopsy instructions and care were reviewed and questions were answered. The patient was encouraged to call [facility] for any additional concerns. The patient was referred to [facility] on May 13, 2019. Pathology results reported by Stacie Acres, RN on 05/05/2019. Electronically Signed   By: Ammie Ferrier M.D.   On: 05/05/2019 08:56   Result Date: 05/05/2019 CLINICAL DATA:  58 year old female presenting for ultrasound-guided biopsy of 2 left breast masses. EXAM: ULTRASOUND GUIDED LEFT BREAST CORE NEEDLE BIOPSY COMPARISON:  Previous exam(s). FINDINGS: I met with the patient and we discussed the procedure of ultrasound-guided biopsy, including benefits and alternatives. We discussed the high likelihood of a successful procedure. We discussed the risks of the procedure, including infection, bleeding, tissue injury, clip migration, and inadequate sampling. Informed written consent was given. The usual time-out protocol was performed immediately prior to the  procedure. #1 Lesion quadrant: Lower-inner quadrant Using sterile technique and 1% Lidocaine as local anesthetic, under direct ultrasound visualization, a 14 gauge spring-loaded device was used to perform biopsy of a mass in the left breast at 9 o'clock, 2 cm from the nipple using an inferior approach. At the conclusion of the procedure a ribbon shaped tissue marker clip was deployed into the biopsy cavity. -------------------------------------------------------------------------------------------------------------------------------------------- #  2 Lesion quadrant: Lower-inner quadrant Using sterile technique and 1% Lidocaine as local anesthetic, under direct ultrasound visualization, a 14 gauge spring-loaded device was used to perform biopsy of a small mass in the left breast at 9 o'clock, 3 cm from the nipple using an inferior approach. At the conclusion of the procedure a coil shaped tissue marker clip was deployed into the biopsy cavity. Follow up 2 view mammogram was performed and dictated separately. IMPRESSION: 1. Ultrasound guided biopsy of a left breast mass at 9 o'clock, 2 cm from the nipple. No apparent complications. 2. Ultrasound guided biopsy of a left breast mass at 9 o'clock, 3 cm from the nipple. No apparent complications. Electronically Signed: By: Ammie Ferrier M.D. On: 05/01/2019 13:34       IMPRESSION/PLAN: 1. 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 Dr. Lisbeth Renshaw discusses the pathology findings and reviews the nature of invasive left breast disease. The consensus from the breast conference includes  mastectomy with sentinel node biopsy due to the multifocal nature of her breast findings.  Dr. Lindi Adie plans for Oncotype Dx score to determine a role for systemic therapy. Her course would also be followed by antiestrogen therapy. We discussed the utility for radiotherapy and discussed scenarios to consider postmastectomy XRT, though it does not  appear to date that she would need this. However she is aware that T staging, and nodal status could lead to the need for treatment. We discussed the risks, benefits, short, and long term effects of radiotherapy if it were needed. We will follow up with the results of her final pathology and meet back if she had high risk findings that would require postmastectomy radiotherapy. 2. Possible genetic predisposition to malignancy. The patient is a candidate for a discussion regarding genetic testing given her personal and family history. She was offered referral and is interested, she will meet with genetics today.    In a visit lasting 45 minutes, greater than 50% of the time was spent face to face discussing her case, and coordinating the patient's care.  The above documentation reflects my direct findings during this shared patient visit. Please see the separate note by Dr. Lisbeth Renshaw on this date for the remainder of the patient's plan of care.    Carola Rhine, PAC

## 2019-05-20 ENCOUNTER — Telehealth: Payer: Self-pay

## 2019-05-20 NOTE — Pre-Procedure Instructions (Signed)
Victoria Richardson  05/20/2019      [facility] STORE [place], [place] [address] [place] [address] Phone: [phone] Fax: [phone]    Your procedure is scheduled on Tuesday, September 8th.  Report to [facility]" at 12:30 P.M.  Call this number if you have problems the morning of surgery:  [phone]   Remember:  Do not eat after midnight.  Please complete your PRE-SURGERY ENSURE that was provided to you by 9:30 A.M.  the morning of surgery.  Please, if able, drink it in one setting. DO NOT SIP.   You may drink clear liquids until 9:30 A.M .  Clear liquids allowed are:  Water, Juice (non-citric and without pulp), Carbonated beverages, Clear Tea, Black Coffee only and Gatorade    Take these medicines the morning of surgery with A SIP OF WATER: None  As of today, STOP taking any Aspirin (unless otherwise instructed by your surgeon), Aleve, Naproxen, Ibuprofen, Motrin, Advil, Goody's, BC's, all herbal medications, fish oil, and all vitamins.    Do not wear jewelry, make-up or nail polish.  Do not wear lotions, powders, or perfumes, or deodorant.  Do not shave 48 hours prior to surgery.    Do not bring valuables to the hospital.  [facility] is not responsible for any belongings or valuables.   [place]- Preparing For Surgery  Before surgery, you can play an important role. Because skin is not sterile, your skin needs to be as free of germs as possible. You can reduce the number of germs on your skin by washing with CHG (chlorahexidine gluconate) Soap before surgery.  CHG is an antiseptic cleaner which kills germs and bonds with the skin to continue killing germs even after washing.    Oral Hygiene is also important to reduce your risk of infection.  Remember - BRUSH YOUR TEETH THE MORNING OF SURGERY WITH YOUR REGULAR TOOTHPASTE  Please do not use if you have an allergy to  CHG or antibacterial soaps. If your skin becomes reddened/irritated stop using the CHG.  Do not shave (including legs and underarms) for at least 48 hours prior to first CHG shower. It is OK to shave your face.  Please follow these instructions carefully.   1. Shower the NIGHT BEFORE SURGERY and the MORNING OF SURGERY with CHG.   2. If you chose to wash your hair, wash your hair first as usual with your normal shampoo.  3. After you shampoo, rinse your hair and body thoroughly to remove the shampoo.  4. Use CHG as you would any other liquid soap. You can apply CHG directly to the skin and wash gently with a scrungie or a clean washcloth.   5. Apply the CHG Soap to your body ONLY FROM THE NECK DOWN.  Do not use on open wounds or open sores. Avoid contact with your eyes, ears, mouth and genitals (private parts). Wash Face and genitals (private parts)  with your normal soap.  6. Wash thoroughly, paying special attention to the area where your surgery will be performed.  7. Thoroughly rinse your body with warm water from the neck down.  8. DO NOT shower/wash with your normal soap after using and rinsing off the CHG Soap.  9. Pat yourself dry with a CLEAN TOWEL.  10. Wear CLEAN PAJAMAS to bed the night before surgery, wear comfortable clothes the morning  of surgery  11. Place CLEAN SHEETS on your bed the night of your first shower and DO NOT SLEEP WITH PETS.    Day of Surgery:  Do not apply any deodorants/lotions.  Please wear clean clothes to the hospital/surgery center.   Remember to brush your teeth WITH YOUR REGULAR TOOTHPASTE.    Contacts, dentures or bridgework may not be worn into surgery.  Leave your suitcase in the car.  After surgery it may be brought to your room.  For patients admitted to the hospital, discharge time will be determined by your treatment team.  Patients discharged the day of surgery will not be allowed to drive home.

## 2019-05-20 NOTE — Telephone Encounter (Signed)
Nutrition Assessment  Reason for Assessment:  Pt attended Breast Clinic on 8/26 and received nutrition packet from nurse navigator  ASSESSMENT:  58 year old female with invasive ductal carcinoma of left breast.  Planning mastectomy, oncotype and antiestrogens.  Past medical history of diverticulitis, smoker, HLD  Spoke with patient via phone this am to introduce self and service at [facility].  Patient reports that she has trouble gaining weight.  Reports that appetite is good but certain foods she has to avoid due to abdominal pain and diverticulitis  Medications:  reviewed  Labs: reviewed  Anthropometrics:   Height: 64 inches Weight: 104 lb 11.2 oz BMI: 17   NUTRITION DIAGNOSIS: Food and nutrition related knowledge deficit related to new diagnosis of breast cancer as evidenced by no prior need for nutrition related information.  INTERVENTION:   Discussed briefly packet of information regarding nutritional tips for breast cancer patients.  Discussed with patient option of trying oral nutrition supplements between meals to provide additional calories and protein to prevent weight loss (BMI 17).  Contact information provided and patient knows to contact me with questions/concerns.    MONITORING, EVALUATION, and GOAL: Pt will consume a healthy plant based diet to maintain lean body mass throughout treatment.   Victoria Richardson B. Victoria Richardson, Victoria Richardson, Victoria Richardson Registered Dietitian [phone] (pager)

## 2019-05-21 ENCOUNTER — Telehealth: Payer: Self-pay | Admitting: *Deleted

## 2019-05-21 ENCOUNTER — Other Ambulatory Visit: Payer: Self-pay

## 2019-05-21 ENCOUNTER — Encounter (HOSPITAL_COMMUNITY): Payer: Self-pay

## 2019-05-21 ENCOUNTER — Encounter (HOSPITAL_COMMUNITY)
Admission: RE | Admit: 2019-05-21 | Discharge: 2019-05-21 | Disposition: A | Discharge status: Home / Self Care | Payer: BC Managed Care – PPO | Source: Ambulatory Visit | Attending: General Surgery | Admitting: General Surgery

## 2019-05-21 ENCOUNTER — Telehealth: Payer: Self-pay | Admitting: Hematology and Oncology

## 2019-05-21 DIAGNOSIS — Z01812 Encounter for preprocedural laboratory examination: Secondary | ICD-10-CM | POA: Diagnosis not present

## 2019-05-21 HISTORY — DX: Malignant neoplasm of unspecified site of unspecified female breast: C50.919

## 2019-05-21 HISTORY — DX: Family history of other specified conditions: Z84.89

## 2019-05-21 LAB — CBC
HCT: 42.3 % (ref 36.0–46.0)
Hemoglobin: 14.8 g/dL (ref 12.0–15.0)
MCH: 33.6 pg (ref 26.0–34.0)
MCHC: 35 g/dL (ref 30.0–36.0)
MCV: 96.1 fL (ref 80.0–100.0)
Platelets: 294 10*3/uL (ref 150–400)
RBC: 4.4 MIL/uL (ref 3.87–5.11)
RDW: 11.9 % (ref 11.5–15.5)
WBC: 7.9 10*3/uL (ref 4.0–10.5)
nRBC: 0 % (ref 0.0–0.2)

## 2019-05-21 LAB — BASIC METABOLIC PANEL
Anion gap: 12 (ref 5–15)
BUN: 9 mg/dL (ref 6–20)
CO2: 26 mmol/L (ref 22–32)
Calcium: 9.1 mg/dL (ref 8.9–10.3)
Chloride: 101 mmol/L (ref 98–111)
Creatinine, Ser: 0.55 mg/dL (ref 0.44–1.00)
GFR calc Af Amer: >60 mL/min (ref >60)
GFR calc non Af Amer: >60 mL/min (ref >60)
Glucose, Bld: 122 mg/dL — ABNORMAL HIGH (ref 70–99)
Potassium: 3 mmol/L — ABNORMAL LOW (ref 3.5–5.1)
Sodium: 139 mmol/L (ref 135–145)

## 2019-05-21 NOTE — Progress Notes (Signed)
PCP - Dr. Dennard Schaumann  Cardiologist - Denies  Chest x-ray - Denies  EKG - Denies  Stress Test - Denies  ECHO - Denies  Cardiac Cath - Denies  AICD-na PM-na LOOP-na  Sleep Study - Denies CPAP - None  LABS- 05/21/2019: CBC, BMP 05/22/2019: COVID  ASA-Denies  ERAS- Yes- Pre-Ensure given  Anesthesia- No  Pt denies having chest pain, sob, or fever at this time. All instructions explained to the pt, with a verbal understanding of the material. Pt agrees to go over the instructions while at home for a better understanding. Pt also instructed to self quarantine after being tested for COVID-19. The opportunity to ask questions was provided.   Coronavirus Screening  Have you experienced the following symptoms:  Cough yes/no: No Fever (>100.79F)  yes/no: No Runny nose yes/no: No Sore throat yes/no: No Difficulty breathing/shortness of breath  yes/no: No  Have you or a family member traveled in the last 14 days and where? yes/no: No   If the patient indicates "YES" to the above questions, their PAT will be rescheduled to limit the exposure to others and, the surgeon will be notified. THE PATIENT WILL NEED TO BE ASYMPTOMATIC FOR 14 DAYS.   If the patient is not experiencing any of these symptoms, the PAT nurse will instruct them to NOT bring anyone with them to their appointment since they may have these symptoms or traveled as well.   Please remind your patients and families that hospital visitation restrictions are in effect and the importance of the restrictions.

## 2019-05-21 NOTE — Telephone Encounter (Signed)
Scheduled appt per 9/03 sch message - pt is aware of appt date and time  

## 2019-05-21 NOTE — Telephone Encounter (Signed)
Spoke with patient to follow up from [facility].  She has no questions or concerns at this time.  Encouraged her to call if she needed anything.

## 2019-05-22 ENCOUNTER — Telehealth: Payer: Self-pay | Admitting: Licensed Clinical Social Worker

## 2019-05-22 ENCOUNTER — Encounter: Payer: Self-pay | Admitting: Licensed Clinical Social Worker

## 2019-05-22 ENCOUNTER — Other Ambulatory Visit (HOSPITAL_COMMUNITY)
Admission: RE | Admit: 2019-05-22 | Discharge: 2019-05-22 | Disposition: A | Discharge status: Home / Self Care | Payer: BC Managed Care – PPO | Source: Ambulatory Visit | Attending: General Surgery | Admitting: General Surgery

## 2019-05-22 ENCOUNTER — Ambulatory Visit: Payer: Self-pay | Admitting: Licensed Clinical Social Worker

## 2019-05-22 DIAGNOSIS — Z803 Family history of malignant neoplasm of breast: Secondary | ICD-10-CM

## 2019-05-22 DIAGNOSIS — Z8051 Family history of malignant neoplasm of kidney: Secondary | ICD-10-CM

## 2019-05-22 DIAGNOSIS — Z1379 Encounter for other screening for genetic and chromosomal anomalies: Secondary | ICD-10-CM | POA: Insufficient documentation

## 2019-05-22 DIAGNOSIS — Z20828 Contact with and (suspected) exposure to other viral communicable diseases: Secondary | ICD-10-CM | POA: Diagnosis not present

## 2019-05-22 DIAGNOSIS — Z8042 Family history of malignant neoplasm of prostate: Secondary | ICD-10-CM

## 2019-05-22 DIAGNOSIS — C50212 Malignant neoplasm of upper-inner quadrant of left female breast: Secondary | ICD-10-CM

## 2019-05-22 DIAGNOSIS — Z800 Family history of malignant neoplasm of digestive organs: Secondary | ICD-10-CM

## 2019-05-22 NOTE — Telephone Encounter (Signed)
Revealed negative genetic testing.   This normal result is reassuring and indicates that it is unlikely Victoria Richardson cancer is due to a hereditary cause.  It is unlikely that there is an increased risk of another cancer due to a mutation in one of these genes.  However, genetic testing is not perfect, and cannot definitively rule out a hereditary cause.  It will be important for her to keep in contact with genetics to learn if any additional testing may be needed in the future.     

## 2019-05-22 NOTE — Progress Notes (Signed)
HPI:  Victoria Richardson was previously seen in the [place] clinic due to a personal and family history of cancer and concerns regarding a hereditary predisposition to cancer. Please refer to our prior cancer genetics clinic note for more information regarding our discussion, assessment and recommendations, at the time. Victoria Richardson recent genetic test results were disclosed to her, as were recommendations warranted by these results. These results and recommendations are discussed in more detail below.  CANCER HISTORY:  Oncology History  Malignant neoplasm of upper-inner quadrant of left breast in female, estrogen receptor positive ([place])  05/06/2019 Initial Diagnosis   Routine screening mammogram detected an irregular left breast mass at the 9 o'clock position, 2.5cm, with a 0.5cm ill-defined adjacent mass and 1.0cm line of suspicious calcifications inferior and medial to the primary mass. No evidence of left axillary adenopathy. Biopsy showed IDC, grade 2-3, intermediate grade DCIS, HER-2 - by FISH, ER+ 100%, PR -, Ki67 20%.   05/21/2019 Genetic Testing   Negative genetic testing on the [facility] Common Hereditary Cancers Panel + Renal Cancer Panel. The Common Hereditary Cancers Panel offered by [facility] includes sequencing and/or deletion duplication testing of the following 48 genes: APC, ATM, AXIN2, BARD1, BMPR1A, BRCA1, BRCA2, BRIP1, CDH1, CDKN2A (p14ARF), CDKN2A (p16INK4a), CKD4, CHEK2, CTNNA1, DICER1, EPCAM (Deletion/duplication testing only), GREM1 (promoter region deletion/duplication testing only), KIT, MEN1, MLH1, MSH2, MSH3, MSH6, MUTYH, NBN, NF1, NHTL1, PALB2, PDGFRA, PMS2, POLD1, POLE, PTEN, RAD50, RAD51C, RAD51D, RNF43, SDHB, SDHC, SDHD, SMAD4, SMARCA4. STK11, TP53, TSC1, TSC2, and VHL.  The following genes were evaluated for sequence changes only: SDHA and HOXB13 c.251G>A variant only. The [facility] Renal/Urinary Tract Cancers Panel analyzes the following 24 genes:BAP1 ,CDC73, CDKN1C,  DICER1, DIS3L2, EPCAM, FH, FLCN, GPC3, MET, MLH1, MSH2, MSH6, PMS2, PTEN, SDHB, SDHC, SMARCA4, SMARCB1, TP53, TSC1, TSC2, VHL, WT1. The report date is 05/21/2019.     FAMILY HISTORY:  We obtained a detailed, 4-generation family history.  Significant diagnoses are listed below: Family History  Problem Relation Age of Onset   Cancer Mother 31       Breast Cancer   Colon polyps Mother    Heart disease Father 71       CAD   Colon cancer Maternal Grandmother        dx 32s   Kidney cancer Maternal Aunt    Prostate cancer Maternal Uncle    Cancer Maternal Uncle        unk type   Breast cancer Cousin        dx early 62s   Hodgkin's lymphoma Cousin    Esophageal cancer Neg Hx    Rectal cancer Neg Hx    Stomach cancer Neg Hx    Pancreatic cancer Neg Hx     Victoria Richardson has a son and a daughter, no cancers. She has one brother, no history of cancer. She has 2 nieces. One of her nieces is a Marine scientist and recommended she have genetic testing.  Victoria Richardson mother had breast cancer at 60 and is living at 36. Patient has 3 maternal aunts, 3 maternal uncles. One of her aunts died from kidney cancer. An uncle died from prostate cancer. Another uncle had cancer but she is unsure of the type. Another uncle had skin cancer and is still living. She also has a maternal cousin who had breast cancer diagnosed in her early 2s. Another maternal cousin had Hodgkin's lymphoma. Patient's maternal grandmother had colon cancer in her 9s, unsure age of death. Maternal grandfather  died young due to drowning.   Victoria Richardson father died at 29. Patient has 2 paternal aunts, 2 paternal uncles, no cancers. No cancers in paternal cousins. She is unsure how old her paternal grandparents were when they passed.  Victoria Richardson is unaware of previous family history of genetic testing for hereditary cancer risks. There is no reported Ashkenazi Jewish ancestry. There is no known consanguinity.   GENETIC TEST RESULTS:  Genetic testing reported out on 05/21/2019 through the [facility] cancer panel found no pathogenic mutations.The Common Hereditary Cancers Panel offered by [facility] includes sequencing and/or deletion duplication testing of the following 48 genes: APC, ATM, AXIN2, BARD1, BMPR1A, BRCA1, BRCA2, BRIP1, CDH1, CDKN2A (p14ARF), CDKN2A (p16INK4a), CKD4, CHEK2, CTNNA1, DICER1, EPCAM (Deletion/duplication testing only), GREM1 (promoter region deletion/duplication testing only), KIT, MEN1, MLH1, MSH2, MSH3, MSH6, MUTYH, NBN, NF1, NHTL1, PALB2, PDGFRA, PMS2, POLD1, POLE, PTEN, RAD50, RAD51C, RAD51D, RNF43, SDHB, SDHC, SDHD, SMAD4, SMARCA4. STK11, TP53, TSC1, TSC2, and VHL.  The following genes were evaluated for sequence changes only: SDHA and HOXB13 c.251G>A variant only.The [facility] Renal/Urinary Tract Cancers Panel analyzes the following 24 genes:BAP1 ,CDC73, CDKN1C, DICER1, DIS3L2, EPCAM, FH, FLCN, GPC3, MET, MLH1, MSH2, MSH6, PMS2, PTEN, SDHB, SDHC, SMARCA4, SMARCB1, TP53, TSC1, TSC2, VHL, WT1.The test report has been scanned into EPIC and is located under the Molecular Pathology section of the Results Review tab.  A portion of the result report is included below for reference.     We discussed with Victoria Richardson that because current genetic testing is not perfect, it is possible there may be a gene mutation in one of these genes that current testing cannot detect, but that chance is small.  We also discussed, that there could be another gene that has not yet been discovered, or that we have not yet tested, that is responsible for the cancer diagnoses in the family. It is also possible there is a hereditary cause for the cancer in the family that Victoria Richardson did not inherit and therefore was not identified in her testing.  Therefore, it is important to remain in touch with cancer genetics in the future so that we can continue to offer Victoria Richardson the most up to date genetic testing.   ADDITIONAL GENETIC TESTING: We discussed  with Victoria Richardson that her genetic testing was fairly extensive.  If there are genes identified to increase cancer risk that can be analyzed in the future, we would be happy to discuss and coordinate this testing at that time.    CANCER SCREENING RECOMMENDATIONS: Victoria Richardson test result is considered negative (normal).  This means that we have not identified a hereditary cause for her personal and family history of cancer at this time. Most cancers happen by chance and this negative test suggests that her cancer may fall into this category.    While reassuring, this does not definitively rule out a hereditary predisposition to cancer. It is still possible that there could be genetic mutations that are undetectable by current technology. There could be genetic mutations in genes that have not been tested or identified to increase cancer risk.  Therefore, it is recommended she continue to follow the cancer management and screening guidelines provided by her oncology and primary healthcare provider.   An individual's cancer risk and medical management are not determined by genetic test results alone. Overall cancer risk assessment incorporates additional factors, including personal medical history, family history, and any available genetic information that may result in a personalized plan for cancer prevention  and surveillance.  RECOMMENDATIONS FOR FAMILY MEMBERS:  Relatives in this family might be at some increased risk of developing cancer, over the general population risk, simply due to the family history of cancer.  We recommended female relatives in this family have a yearly mammogram beginning at age 2, or 71 years younger than the earliest onset of cancer, an annual clinical breast exam, and perform monthly breast self-exams. Female relatives in this family should also have a gynecological exam as recommended by their primary provider. All family members should have a colonoscopy by age 78, or as  directed by their physicians.  FOLLOW-UP: Lastly, we discussed with Victoria Richardson that cancer genetics is a rapidly advancing field and it is possible that new genetic tests will be appropriate for her and/or her family members in the future. We encouraged her to remain in contact with cancer genetics on an annual basis so we can update her personal and family histories and let her know of advances in cancer genetics that may benefit this family.   Our contact number was provided. Victoria Richardson questions were answered to her satisfaction, and she knows she is welcome to call us at anytime with additional questions or concerns.   Faith Rogue, MS, [facility] Genetic Counselor [email or web address] Phone: [phone]

## 2019-05-23 LAB — NOVEL CORONAVIRUS, NAA (HOSP ORDER, SEND-OUT TO REF LAB; TAT 18-24 HRS): SARS-CoV-2, NAA: NOT DETECTED

## 2019-05-26 ENCOUNTER — Other Ambulatory Visit: Payer: Self-pay

## 2019-05-26 ENCOUNTER — Encounter (HOSPITAL_COMMUNITY): Payer: Self-pay | Admitting: Surgery

## 2019-05-26 ENCOUNTER — Observation Stay (HOSPITAL_COMMUNITY)
Admission: RE | Admit: 2019-05-26 | Discharge: 2019-05-27 | Disposition: A | Discharge status: Home / Self Care | Payer: BC Managed Care – PPO | Attending: General Surgery | Admitting: General Surgery

## 2019-05-26 ENCOUNTER — Ambulatory Visit (HOSPITAL_COMMUNITY): Payer: BC Managed Care – PPO | Admitting: Anesthesiology

## 2019-05-26 ENCOUNTER — Encounter (HOSPITAL_COMMUNITY)
Admission: RE | Admit: 2019-05-26 | Discharge: 2019-05-26 | Disposition: A | Discharge status: Home / Self Care | Payer: BC Managed Care – PPO | Source: Ambulatory Visit | Attending: General Surgery | Admitting: General Surgery

## 2019-05-26 ENCOUNTER — Encounter (HOSPITAL_COMMUNITY)
Admission: RE | Disposition: A | Discharge status: Home / Self Care | Payer: Self-pay | Source: Home / Self Care | Attending: General Surgery

## 2019-05-26 ENCOUNTER — Encounter: Payer: Self-pay | Admitting: *Deleted

## 2019-05-26 DIAGNOSIS — C50312 Malignant neoplasm of lower-inner quadrant of left female breast: Secondary | ICD-10-CM | POA: Diagnosis not present

## 2019-05-26 DIAGNOSIS — G8918 Other acute postprocedural pain: Secondary | ICD-10-CM | POA: Diagnosis not present

## 2019-05-26 DIAGNOSIS — Z170 Estrogen receptor positive status [ER+]: Secondary | ICD-10-CM | POA: Insufficient documentation

## 2019-05-26 DIAGNOSIS — Z803 Family history of malignant neoplasm of breast: Secondary | ICD-10-CM | POA: Diagnosis not present

## 2019-05-26 DIAGNOSIS — C50212 Malignant neoplasm of upper-inner quadrant of left female breast: Secondary | ICD-10-CM | POA: Diagnosis not present

## 2019-05-26 DIAGNOSIS — C50912 Malignant neoplasm of unspecified site of left female breast: Secondary | ICD-10-CM | POA: Diagnosis not present

## 2019-05-26 DIAGNOSIS — F17210 Nicotine dependence, cigarettes, uncomplicated: Secondary | ICD-10-CM | POA: Insufficient documentation

## 2019-05-26 DIAGNOSIS — K5792 Diverticulitis of intestine, part unspecified, without perforation or abscess without bleeding: Secondary | ICD-10-CM | POA: Diagnosis not present

## 2019-05-26 DIAGNOSIS — C50812 Malignant neoplasm of overlapping sites of left female breast: Secondary | ICD-10-CM | POA: Diagnosis not present

## 2019-05-26 HISTORY — PX: MASTECTOMY W/ SENTINEL NODE BIOPSY: SHX2001

## 2019-05-26 SURGERY — MASTECTOMY WITH SENTINEL LYMPH NODE BIOPSY
Anesthesia: General | Site: Breast | Laterality: Left

## 2019-05-26 MED ORDER — LIDOCAINE 2% (20 MG/ML) 5 ML SYRINGE
INTRAMUSCULAR | Status: DC | PRN
  Administered 2019-05-26: 40 mg via INTRAVENOUS

## 2019-05-26 MED ORDER — FENTANYL CITRATE (PF) 100 MCG/2ML IJ SOLN
INTRAMUSCULAR | Status: AC
  Administered 2019-05-26: 100 ug via INTRAVENOUS
  Filled 2019-05-26: qty 2

## 2019-05-26 MED ORDER — SUGAMMADEX SODIUM 200 MG/2ML IV SOLN
INTRAVENOUS | Status: DC | PRN
  Administered 2019-05-26: 200 mg via INTRAVENOUS

## 2019-05-26 MED ORDER — FENTANYL CITRATE (PF) 100 MCG/2ML IJ SOLN
100.0000 ug | Freq: Once | INTRAMUSCULAR | Status: AC
  Administered 2019-05-26: 14:00:00 100 ug via INTRAVENOUS

## 2019-05-26 MED ORDER — DEXAMETHASONE SODIUM PHOSPHATE 10 MG/ML IJ SOLN
INTRAMUSCULAR | Status: AC
  Filled 2019-05-26: qty 1

## 2019-05-26 MED ORDER — PROPOFOL 10 MG/ML IV BOLUS
INTRAVENOUS | Status: DC | PRN
  Administered 2019-05-26: 80 mg via INTRAVENOUS
  Administered 2019-05-26: 30 mg via INTRAVENOUS
  Administered 2019-05-26: 50 mg via INTRAVENOUS
  Administered 2019-05-26: 40 mg via INTRAVENOUS

## 2019-05-26 MED ORDER — OXYCODONE HCL 5 MG PO TABS
ORAL_TABLET | ORAL | Status: AC
  Filled 2019-05-26: qty 1

## 2019-05-26 MED ORDER — TRAMADOL HCL 50 MG PO TABS: 50.0000 mg | ORAL_TABLET | Freq: Four times a day (QID) | ORAL | Status: DC | PRN

## 2019-05-26 MED ORDER — ROCURONIUM BROMIDE 10 MG/ML (PF) SYRINGE
PREFILLED_SYRINGE | INTRAVENOUS | Status: DC | PRN
  Administered 2019-05-26: 40 mg via INTRAVENOUS

## 2019-05-26 MED ORDER — ACETAMINOPHEN 500 MG PO TABS
1000.0000 mg | ORAL_TABLET | Freq: Four times a day (QID) | ORAL | Status: DC
  Administered 2019-05-26 – 2019-05-27 (×3): 1000 mg via ORAL
  Filled 2019-05-26 (×3): qty 2

## 2019-05-26 MED ORDER — ACETAMINOPHEN 500 MG PO TABS
1000.0000 mg | ORAL_TABLET | Freq: Once | ORAL | Status: AC
  Administered 2019-05-26: 1000 mg via ORAL
  Filled 2019-05-26: qty 2

## 2019-05-26 MED ORDER — PROMETHAZINE HCL 25 MG/ML IJ SOLN: 6.2500 mg | INTRAMUSCULAR | Status: DC | PRN

## 2019-05-26 MED ORDER — HYDROMORPHONE HCL 1 MG/ML IJ SOLN
0.2500 mg | INTRAMUSCULAR | Status: DC | PRN
  Administered 2019-05-26 (×2): 0.5 mg via INTRAVENOUS

## 2019-05-26 MED ORDER — MORPHINE SULFATE (PF) 2 MG/ML IV SOLN: 2.0000 mg | INTRAVENOUS | Status: DC | PRN

## 2019-05-26 MED ORDER — LACTATED RINGERS IV SOLN
INTRAVENOUS | Status: DC
  Administered 2019-05-26: 14:00:00 via INTRAVENOUS

## 2019-05-26 MED ORDER — ACETAMINOPHEN 500 MG PO TABS: 1000.0000 mg | ORAL_TABLET | ORAL | Status: DC

## 2019-05-26 MED ORDER — ONDANSETRON 4 MG PO TBDP: 4.0000 mg | ORAL_TABLET | Freq: Four times a day (QID) | ORAL | Status: DC | PRN

## 2019-05-26 MED ORDER — MEPERIDINE HCL 25 MG/ML IJ SOLN: 6.2500 mg | INTRAMUSCULAR | Status: DC | PRN

## 2019-05-26 MED ORDER — TECHNETIUM TC 99M SULFUR COLLOID FILTERED
1.0000 | Freq: Once | INTRAVENOUS | Status: AC | PRN
  Administered 2019-05-26: 1 via INTRADERMAL

## 2019-05-26 MED ORDER — DEXAMETHASONE SODIUM PHOSPHATE 10 MG/ML IJ SOLN
INTRAMUSCULAR | Status: DC | PRN
  Administered 2019-05-26: 5 mg via INTRAVENOUS

## 2019-05-26 MED ORDER — ONDANSETRON HCL 4 MG/2ML IJ SOLN
INTRAMUSCULAR | Status: AC
  Filled 2019-05-26: qty 2

## 2019-05-26 MED ORDER — GABAPENTIN 100 MG PO CAPS
100.0000 mg | ORAL_CAPSULE | ORAL | Status: AC
  Administered 2019-05-26: 100 mg via ORAL
  Filled 2019-05-26: qty 1

## 2019-05-26 MED ORDER — MIDAZOLAM HCL 2 MG/2ML IJ SOLN
INTRAMUSCULAR | Status: AC
  Administered 2019-05-26: 2 mg via INTRAVENOUS
  Filled 2019-05-26: qty 2

## 2019-05-26 MED ORDER — 0.9 % SODIUM CHLORIDE (POUR BTL) OPTIME
TOPICAL | Status: DC | PRN
  Administered 2019-05-26: 1000 mL

## 2019-05-26 MED ORDER — SODIUM CHLORIDE 0.9 % IV SOLN
INTRAVENOUS | Status: DC
  Administered 2019-05-26: 19:00:00 via INTRAVENOUS

## 2019-05-26 MED ORDER — HYDROMORPHONE HCL 1 MG/ML IJ SOLN
INTRAMUSCULAR | Status: AC
  Filled 2019-05-26: qty 1

## 2019-05-26 MED ORDER — LIDOCAINE 2% (20 MG/ML) 5 ML SYRINGE
INTRAMUSCULAR | Status: AC
  Filled 2019-05-26: qty 5

## 2019-05-26 MED ORDER — SIMETHICONE 80 MG PO CHEW: 40.0000 mg | CHEWABLE_TABLET | Freq: Four times a day (QID) | ORAL | Status: DC | PRN

## 2019-05-26 MED ORDER — MIDAZOLAM HCL 2 MG/2ML IJ SOLN
2.0000 mg | Freq: Once | INTRAMUSCULAR | Status: AC
  Administered 2019-05-26: 14:00:00 2 mg via INTRAVENOUS

## 2019-05-26 MED ORDER — ONDANSETRON HCL 4 MG/2ML IJ SOLN
INTRAMUSCULAR | Status: DC | PRN
  Administered 2019-05-26: 4 mg via INTRAVENOUS

## 2019-05-26 MED ORDER — PROPOFOL 10 MG/ML IV BOLUS
INTRAVENOUS | Status: AC
  Filled 2019-05-26: qty 20

## 2019-05-26 MED ORDER — METHYLENE BLUE 0.5 % INJ SOLN
INTRAVENOUS | Status: AC
  Filled 2019-05-26: qty 10

## 2019-05-26 MED ORDER — FENTANYL CITRATE (PF) 250 MCG/5ML IJ SOLN
INTRAMUSCULAR | Status: AC
  Filled 2019-05-26: qty 5

## 2019-05-26 MED ORDER — CEFAZOLIN SODIUM-DEXTROSE 2-4 GM/100ML-% IV SOLN
2.0000 g | INTRAVENOUS | Status: AC
  Administered 2019-05-26: 15:00:00 2 g via INTRAVENOUS
  Filled 2019-05-26: qty 100

## 2019-05-26 MED ORDER — FENTANYL CITRATE (PF) 250 MCG/5ML IJ SOLN
INTRAMUSCULAR | Status: DC | PRN
  Administered 2019-05-26: 50 ug via INTRAVENOUS
  Administered 2019-05-26: 100 ug via INTRAVENOUS

## 2019-05-26 MED ORDER — METHOCARBAMOL 500 MG PO TABS: 500.0000 mg | ORAL_TABLET | Freq: Four times a day (QID) | ORAL | Status: DC | PRN

## 2019-05-26 MED ORDER — OXYCODONE HCL 5 MG/5ML PO SOLN: 5.0000 mg | Freq: Once | ORAL | Status: AC | PRN

## 2019-05-26 MED ORDER — ONDANSETRON HCL 4 MG/2ML IJ SOLN: 4.0000 mg | Freq: Four times a day (QID) | INTRAMUSCULAR | Status: DC | PRN

## 2019-05-26 MED ORDER — KETOROLAC TROMETHAMINE 15 MG/ML IJ SOLN
15.0000 mg | INTRAMUSCULAR | Status: AC
  Administered 2019-05-26: 15 mg via INTRAVENOUS
  Filled 2019-05-26: qty 1

## 2019-05-26 MED ORDER — MIDAZOLAM HCL 2 MG/2ML IJ SOLN
INTRAMUSCULAR | Status: AC
  Filled 2019-05-26: qty 2

## 2019-05-26 MED ORDER — OXYCODONE HCL 5 MG PO TABS
5.0000 mg | ORAL_TABLET | Freq: Once | ORAL | Status: AC | PRN
  Administered 2019-05-26: 5 mg via ORAL

## 2019-05-26 SURGICAL SUPPLY — 56 items
APPLIER CLIP 9.375 MED OPEN (MISCELLANEOUS)
BINDER BREAST LRG (GAUZE/BANDAGES/DRESSINGS) IMPLANT
BINDER BREAST XLRG (GAUZE/BANDAGES/DRESSINGS) IMPLANT
CANISTER SUCT 3000ML PPV (MISCELLANEOUS) ×2 IMPLANT
CHLORAPREP W/TINT 26 (MISCELLANEOUS) ×2 IMPLANT
CLIP APPLIE 9.375 MED OPEN (MISCELLANEOUS) IMPLANT
CLOSURE STERI-STRIP 1/4X4 (GAUZE/BANDAGES/DRESSINGS) ×1 IMPLANT
CONT SPEC 4OZ CLIKSEAL STRL BL (MISCELLANEOUS) ×2 IMPLANT
COVER PROBE W GEL 5X96 (DRAPES) ×2 IMPLANT
COVER SURGICAL LIGHT HANDLE (MISCELLANEOUS) ×2 IMPLANT
COVER WAND RF STERILE (DRAPES) ×2 IMPLANT
DERMABOND ADVANCED (GAUZE/BANDAGES/DRESSINGS) ×1
DERMABOND ADVANCED .7 DNX12 (GAUZE/BANDAGES/DRESSINGS) ×1 IMPLANT
DRAIN CHANNEL 19F RND (DRAIN) ×2 IMPLANT
DRAPE LAPAROSCOPIC ABDOMINAL (DRAPES) ×2 IMPLANT
ELECT BLADE 4.0 EZ CLEAN MEGAD (MISCELLANEOUS) ×2
ELECT CAUTERY BLADE 6.4 (BLADE) ×2 IMPLANT
ELECT REM PT RETURN 9FT ADLT (ELECTROSURGICAL) ×2
ELECTRODE BLDE 4.0 EZ CLN MEGD (MISCELLANEOUS) ×1 IMPLANT
ELECTRODE REM PT RTRN 9FT ADLT (ELECTROSURGICAL) ×1 IMPLANT
EVACUATOR SILICONE 100CC (DRAIN) ×2 IMPLANT
GAUZE SPONGE 4X4 12PLY STRL (GAUZE/BANDAGES/DRESSINGS) ×2 IMPLANT
GLOVE BIO SURGEON STRL SZ7 (GLOVE) ×2 IMPLANT
GLOVE BIOGEL PI IND STRL 7.5 (GLOVE) ×1 IMPLANT
GLOVE BIOGEL PI INDICATOR 7.5 (GLOVE) ×1
GLOVE ECLIPSE 7.0 STRL STRAW (GLOVE) ×1 IMPLANT
GOWN STRL REUS W/ TWL LRG LVL3 (GOWN DISPOSABLE) ×3 IMPLANT
GOWN STRL REUS W/TWL LRG LVL3 (GOWN DISPOSABLE) ×3
KIT BASIN OR (CUSTOM PROCEDURE TRAY) ×2 IMPLANT
KIT TURNOVER KIT B (KITS) ×2 IMPLANT
MARKER SKIN DUAL TIP RULER LAB (MISCELLANEOUS) ×2 IMPLANT
NDL 18GX1X1/2 (RX/OR ONLY) (NEEDLE) IMPLANT
NDL FILTER BLUNT 18X1 1/2 (NEEDLE) IMPLANT
NDL HYPO 25GX1X1/2 BEV (NEEDLE) IMPLANT
NEEDLE 18GX1X1/2 (RX/OR ONLY) (NEEDLE) IMPLANT
NEEDLE FILTER BLUNT 18X 1/2SAF (NEEDLE)
NEEDLE FILTER BLUNT 18X1 1/2 (NEEDLE) IMPLANT
NEEDLE HYPO 25GX1X1/2 BEV (NEEDLE) IMPLANT
NS IRRIG 1000ML POUR BTL (IV SOLUTION) ×2 IMPLANT
PACK GENERAL/GYN (CUSTOM PROCEDURE TRAY) ×2 IMPLANT
PAD ARMBOARD 7.5X6 YLW CONV (MISCELLANEOUS) ×2 IMPLANT
PENCIL SMOKE EVACUATOR (MISCELLANEOUS) ×2 IMPLANT
PIN SAFETY STERILE (MISCELLANEOUS) ×2 IMPLANT
SPECIMEN JAR X LARGE (MISCELLANEOUS) ×2 IMPLANT
STAPLER VISISTAT 35W (STAPLE) ×2 IMPLANT
STRIP CLOSURE SKIN 1/2X4 (GAUZE/BANDAGES/DRESSINGS) ×2 IMPLANT
SUT ETHILON 2 0 FS 18 (SUTURE) ×2 IMPLANT
SUT MON AB 4-0 PC3 18 (SUTURE) ×2 IMPLANT
SUT SILK 2 0 SH (SUTURE) IMPLANT
SUT VIC AB 3-0 54X BRD REEL (SUTURE) ×1 IMPLANT
SUT VIC AB 3-0 BRD 54 (SUTURE) ×1
SUT VIC AB 3-0 SH 18 (SUTURE) ×2 IMPLANT
SUT VIC AB 3-0 SH 8-18 (SUTURE) ×2 IMPLANT
SYR CONTROL 10ML LL (SYRINGE) IMPLANT
TOWEL GREEN STERILE (TOWEL DISPOSABLE) ×2 IMPLANT
TOWEL GREEN STERILE FF (TOWEL DISPOSABLE) ×2 IMPLANT

## 2019-05-26 NOTE — Anesthesia Procedure Notes (Addendum)
Anesthesia Regional Block: Pectoralis block   Pre-Anesthetic Checklist: ,, timeout performed, Correct Patient, Correct Site, Correct Laterality, Correct Procedure, Correct Position, site marked, Risks and benefits discussed,  Surgical consent,  Pre-op evaluation,  At surgeon's request and post-op pain management  Laterality: Left  Prep: Maximum Sterile Barrier Precautions used, chloraprep       Needles:  Injection technique: Single-shot  Needle Type: Echogenic Stimulator Needle     Needle Length: 9cm  Needle Gauge: 22     Additional Needles:   Procedures:,,,, ultrasound used (permanent image in chart),,,,  Narrative:  Start time: 05/26/2019 1:50 PM End time: 05/26/2019 2:01 PM Injection made incrementally with aspirations every 5 mL.  Performed by: Personally  Anesthesiologist: Pervis Hocking, DO  Additional Notes: Monitors applied. No increased pain on injection. No increased resistance to injection. Injection made in 5cc increments. Good needle visualization. Patient tolerated procedure well.

## 2019-05-26 NOTE — Discharge Instructions (Signed)
[facility] °[phone] ° °MASTECTOMY: POST OP INSTRUCTIONS °Take 400 mg of ibuprofen every 8 hours or 650 mg tylenol every 6 hours for next 72 hours then as needed. Use ice several times daily also. °Always review your discharge instruction sheet given to you by the facility where your surgery was performed. ° °IF YOU HAVE DISABILITY OR FAMILY LEAVE FORMS, YOU MUST BRING THEM TO THE OFFICE FOR PROCESSING.   °DO NOT GIVE THEM TO YOUR DOCTOR. °A prescription for pain medication may be given to you upon discharge.  Take your pain medication as prescribed, if needed.  If narcotic pain medicine is not needed, then you may take acetaminophen (Tylenol), naprosyn (Alleve) or ibuprofen (Advil) as needed. °1. Take your usually prescribed medications unless otherwise directed. °2. If you need a refill on your pain medication, please contact your pharmacy.  They will contact our office to request authorization.  Prescriptions will not be filled after 5pm or on week-ends. °3. You should follow a light diet the first few days after arrival home, such as soup and crackers, etc.  Resume your normal diet the day after surgery. °4. Most patients will experience some swelling and bruising on the chest and underarm.  Ice packs will help.  Swelling and bruising can take several days to resolve. Wear the binder day and night until you return to the office.  °5. It is common to experience some constipation if taking pain medication after surgery.  Increasing fluid intake and taking a stool softener (such as Colace) will usually help or prevent this problem from occurring.  A mild laxative (Milk of Magnesia or Miralax) should be taken according to package instructions if there are no bowel movements after 48 hours. °6. Unless discharge instructions indicate otherwise, leave your bandage dry and in place until your next appointment in 3-5 days.  You may take a limited sponge bath.  No tube baths or showers until the  drains are removed.  You may have steri-strips (small skin tapes) in place directly over the incision.  These strips should be left on the skin for 7-10 days. If you have glue it will come off in next couple week.  Any sutures will be removed at an office visit °7. DRAINS:  If you have drains in place, it is important to keep a list of the amount of drainage produced each day in your drains.  Before leaving the hospital, you should be instructed on drain care.  Call our office if you have any questions about your drains. I will remove your drains when they put out less than 30 cc or ml for 2 consecutive days. °8. ACTIVITIES:  You may resume regular (light) daily activities beginning the next day--such as daily self-care, walking, climbing stairs--gradually increasing activities as tolerated.  You may have sexual intercourse when it is comfortable.  Refrain from any heavy lifting or straining until approved by your doctor. °a. You may drive when you are no longer taking prescription pain medication, you can comfortably wear a seatbelt, and you can safely maneuver your car and apply brakes. °b. RETURN TO WORK:  __________________________________________________________ °9. You should see your doctor in the office for a follow-up appointment approximately 3-5 days after your surgery.  Your doctor’s nurse will typically make your follow-up appointment when she calls you with your pathology report.  Expect your pathology report 3-4business days after surgery. °10. OTHER INSTRUCTIONS: ______________________________________________________________________________________________ ____________________________________________________________________________________________ °WHEN TO CALL YOUR DR Macintyre Alexa: °1. Fever over 101.0 °  2. Nausea and/or vomiting °3. Extreme swelling or bruising °4. Continued bleeding from incision. °5. Increased pain, redness, or drainage from the incision. °The clinic staff is available to answer your  questions during regular business hours.  Please don’t hesitate to call and ask to speak to one of the nurses for clinical concerns.  If you have a medical emergency, go to the nearest emergency room or call 911.  A surgeon from [facility] is always on call at the hospital. °[address], [address], [place], [place]  [address] ? [address] [address], [place],    [address] °[phone] ? [phone] ? [phone] [phone] °Web site: [email or web address] ° °

## 2019-05-26 NOTE — Progress Notes (Signed)
Pt arrived to room 6N23 via wheelchair after surgery. Received report from [place], [place] in PACU. See assessment. Will continue to monitor.

## 2019-05-26 NOTE — Op Note (Signed)
Preoperative diagnosis: clinical stage II left breast cancer Postoperative diagnosis: same as above Procedure: 1.left total mastectomy 2. Left deep axillary sentinel node biopsy Surgeon: Dr Serita Grammes Anesthesia general with pectoral block EBL: 50 cc Specimens:  1. Left mastectomy short superior, long lateral 2. Left axillary sentinel nodes with highest count [id number] Complications none Drains 19 Fr Blake drain Sponge and needle count correct dispo to recovery stable  Indications: This is a 66 yof who has clinical stage II left breast cancer who has elected to proceed with left mastectomy and left axillary sn biopsy.  Procedure: After informed consent obtained she first underwent injection of technetium in the standard periareolar fashion.  She underwent a pectoral block.  SCDs were in place.  She was given cefazolin.  She was then placed under general anesthesia without complication.  She was prepped and draped in the standard sterile surgical fashion.  Surgical timeout was then performed.  I made a large elliptical incision to encompass the nipple areolar complex.  I created flaps of the clavicle, parasternal area, inframammary crease, and latissimus laterally.  I then remove the breast and the pectoralis fascia from the underlying pectoralis muscle.  This was marked as above and passed off the table.  I then identified several sentinel nodes with the highest count of 567.  There were a couple of palpable nodes that were removed with this.  There were no more palpable nodes and no background radioactivity.  There was actually very little tissue in her axilla after the sentinel node biopsy.  I was able to clearly visualize her axillary vein and thoracodorsal bundle.  I then obtained hemostasis.  A 19 [place] Blake drain was inserted and secured with a 2-0 nylon suture.  I then closed the dermis with 3-0 Vicryl.  Laterally I closed the dermis down to the chest wall.  I then closed the skin with 4-0  Monocryl.  Glue and Steri-Strips were then applied.  She tolerated this well was extubated and transferred recovery stable.

## 2019-05-26 NOTE — Progress Notes (Signed)
[place] Psychosocial Distress Screening Clinical Social Work  Clinical Social Work was referred by distress screening protocol.  The patient scored a 8 on the Psychosocial Distress Thermometer which indicates severe distress. Clinical Social Worker met with patient in exam room to assess for distress and other psychosocial needs.   ONCBCN DISTRESS SCREENING 05/26/2019  Distress experienced in past week (1-10) 8  Emotional problem type Nervousness/Anxiety  Physical Problem type Constipation/diarrhea  Referral to support programs Yes   Clinical Social Worker follow up needed: No.  Counseling Intern Notes: I spoke with Victoria Richardson today via telephone.  She stated that she is feeling "OK" now, but that she does experience some anxiety related to her cancer diagnosis. She was not interested in one-on-one counseling services at this time.  I will follow-up with the patient in 3 weeks to check-in to determine if anything has changed in regards to her need for support services.  [place] Counseling Intern  Voicemail: [phone]

## 2019-05-26 NOTE — H&P (Signed)
  58 yof referred by Dr Dennard Schaumann. she has no prior breast history. she has screening detected left breast mass. she has fh in her mom Emilio Math). she works at [employer] in [place]. She lives in [place] with her husband. she has no real PMH. she smokes 1 ppd. she had screening mm that shows an irregular mass/distortion in the lower inner left breast. there is a 2.5x2x1.3 cm mass with a satellite measuring 5x4x3 mm. the axillary [place] is negative. there are 4.8 cm of calcifications associated with this also. both masses were biopsied and the clips are 1.8 cm apart. both biopsies are IDC grade II-III with int grade dcis, er pos, pr neg, her 2 neg neg, Ki is 20%. she is here to discuss options today   Past Surgical History Tawni Pummel, RN; 05/13/2019 7:16 AM) Breast Biopsy  Left. Oral Surgery   Diagnostic Studies History Tawni Pummel, RN; 05/13/2019 7:16 AM) Colonoscopy  within last year Mammogram  within last year Pap Smear  1-5 years ago  Medication History Tawni Pummel, RN; 05/13/2019 7:16 AM) Medications Reconciled  Social History Tawni Pummel, RN; 05/13/2019 7:16 AM) Caffeine use  Carbonated beverages, Coffee, Tea. No alcohol use  No drug use  Tobacco use  Current every day smoker.  Family History Tawni Pummel, RN; 05/13/2019 7:16 AM) Alcohol Abuse  Father. Anesthetic complications  Mother. Arthritis  Mother. Breast Cancer  Mother. Cancer  Mother. Colon Cancer  Family Members In General. Heart Disease  Father. Hypertension  Brother. Ischemic Bowel Disease  Mother. Respiratory Condition  Father.  Pregnancy / Birth History Tawni Pummel, RN; 05/13/2019 7:16 AM) Age at menarche  45 years. Age of menopause  29-55 Gravida  2 Irregular periods  Maternal age  4-25 Para  2  Other Problems Tawni Pummel, RN; 05/13/2019 7:16 AM) Diverticulosis     Physical Exam Rolm Bookbinder MD; 05/13/2019 9:44 AM) General Mental  Status-Alert. Orientation-Oriented X3. Head and Neck Trachea-midline. Eye Sclera/Conjunctiva - Bilateral-No scleral icterus. Chest and Lung Exam Chest and lung exam reveals -quiet, even and easy respiratory effort with no use of accessory muscles and on auscultation, normal breath sounds, no adventitious sounds and normal vocal resonance. Breast Nipples-No Discharge. Note: left breast medial mass about 3-4 cm mobile nontender Cardiovascular Cardiovascular examination reveals -normal heart sounds, regular rate and rhythm with no murmurs. Abdomen Note: soft nt/nd Neurologic Neurologic evaluation reveals -alert and oriented x 3 with no impairment of recent or remote memory. Lymphatic Head & Neck General Head & Neck Lymphatics: Bilateral - Description - Normal. Axillary General Axillary Region: Bilateral - Description - Normal. 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 we discussed all available treatment options. We discussed she is clinically and radiologically node negative but still need to know her nodal status. I discussed sentinel node biopsy with her with performance of radioactive tracer. discussed risk of lymphedema and small risk of returning to or for node dissection.discussed shoulder dysfunction. I don't think she is candidate for lumpectomy. I recommended mastectomy to her. she does not desire reconstruction and the smoking makes this more difficult. discussed procedure, risks and recovery. discussed further therapy will be decided on after surgery.

## 2019-05-26 NOTE — Transfer of Care (Signed)
Immediate Anesthesia Transfer of Care Note  Patient: Victoria Richardson  Procedure(s) Performed: LEFT MASTECTOMY WITH LEFT AXILLARY SENTINEL LYMPH NODE BIOPSY (Left Breast)  Patient Location: PACU  Anesthesia Type:General  Level of Consciousness: awake, alert , oriented and patient cooperative  Airway & Oxygen Therapy: Patient Spontanous Breathing and Patient connected to nasal cannula oxygen  Post-op Assessment: Report given to RN, Post -op Vital signs reviewed and stable and Patient moving all extremities  Post vital signs: Reviewed and stable  Last Vitals:  Vitals Value Taken Time  BP 175/99 05/26/19 1631  Temp    Pulse 95 05/26/19 1635  Resp 18 05/26/19 1635  SpO2 100 % 05/26/19 1635  Vitals shown include unvalidated device data.  Last Pain:  Vitals:   05/26/19 1425  PainSc: 0-No pain      Patients Stated Pain Goal: 4 ([id number])  Complications: No apparent anesthesia complications

## 2019-05-26 NOTE — Anesthesia Procedure Notes (Signed)
Procedure Name: Intubation Date/Time: 05/26/2019 2:43 PM Performed by: Myna Bright, CRNA Pre-anesthesia Checklist: Patient identified, Emergency Drugs available, Suction available and Patient being monitored Patient Re-evaluated:Patient Re-evaluated prior to induction Oxygen Delivery Method: Circle system utilized Preoxygenation: Pre-oxygenation with 100% oxygen Induction Type: IV induction Ventilation: Mask ventilation without difficulty Laryngoscope Size: Mac and 3 Grade View: Grade II Tube type: Oral Tube size: 7.0 mm Number of attempts: 1 Airway Equipment and Method: Stylet Placement Confirmation: ETT inserted through vocal cords under direct vision,  positive ETCO2 and breath sounds checked- equal and bilateral Secured at: 21 cm Tube secured with: Tape Dental Injury: Teeth and Oropharynx as per pre-operative assessment

## 2019-05-26 NOTE — Anesthesia Preprocedure Evaluation (Signed)
Anesthesia Evaluation  Patient identified by MRN, date of birth, ID band Patient awake    Reviewed: Allergy & Precautions, NPO status , Patient's Chart, lab work & pertinent test results  Airway Mallampati: II  TM Distance: >3 FB Neck ROM: Full    Dental no notable dental hx.    Pulmonary Current Smoker,    Pulmonary exam normal breath sounds clear to auscultation       Cardiovascular negative cardio ROS Normal cardiovascular exam Rhythm:Regular Rate:Normal     Neuro/Psych negative neurological ROS  negative psych ROS   GI/Hepatic negative GI ROS, Neg liver ROS,   Endo/Other  negative endocrine ROS  Renal/GU negative Renal ROS  negative genitourinary   Musculoskeletal negative musculoskeletal ROS (+)   Abdominal   Peds negative pediatric ROS (+)  Hematology negative hematology ROS (+)   Anesthesia Other Findings   Reproductive/Obstetrics negative OB ROS                             Anesthesia Physical Anesthesia Plan  ASA: II  Anesthesia Plan: General   Post-op Pain Management:    Induction: Intravenous  PONV Risk Score and Plan: 2 and Ondansetron, Dexamethasone and Midazolam  Airway Management Planned: Oral ETT  Additional Equipment: None  Intra-op Plan:   Post-operative Plan: Extubation in OR  Informed Consent: I have reviewed the patients History and Physical, chart, labs and discussed the procedure including the risks, benefits and alternatives for the proposed anesthesia with the patient or authorized representative who has indicated his/her understanding and acceptance.     Dental advisory given  Plan Discussed with: CRNA  Anesthesia Plan Comments:         Anesthesia Quick Evaluation

## 2019-05-26 NOTE — Plan of Care (Signed)
  Problem: Education: Goal: Knowledge of General Education information will improve Description: Including pain rating scale, medication(s)/side effects and non-pharmacologic comfort measures Outcome: Progressing   Problem: Health Behavior/Discharge Planning: Goal: Ability to manage health-related needs will improve Outcome: Progressing   Problem: Clinical Measurements: Goal: Diagnostic test results will improve Outcome: Progressing Goal: Respiratory complications will improve Outcome: Progressing Goal: Cardiovascular complication will be avoided Outcome: Progressing   Problem: Activity: Goal: Risk for activity intolerance will decrease Outcome: Progressing   Problem: Nutrition: Goal: Adequate nutrition will be maintained Outcome: Progressing   Problem: Coping: Goal: Level of anxiety will decrease Outcome: Progressing   Problem: Elimination: Goal: Will not experience complications related to urinary retention Outcome: Progressing   Problem: Pain Managment: Goal: General experience of comfort will improve Outcome: Progressing   Problem: Safety: Goal: Ability to remain free from injury will improve Outcome: Progressing   Problem: Skin Integrity: Goal: Risk for impaired skin integrity will decrease Outcome: Progressing   

## 2019-05-26 NOTE — Interval H&P Note (Signed)
History and Physical Interval Note:  05/26/2019 2:18 PM  Victoria Richardson  has presented today for surgery, with the diagnosis of LEFT BREAST CANCER.  The various methods of treatment have been discussed with the patient and family. After consideration of risks, benefits and other options for treatment, the patient has consented to  Procedure(s): LEFT MASTECTOMY WITH LEFT AXILLARY SENTINEL LYMPH NODE BIOPSY (Left) as a surgical intervention.  The patient's history has been reviewed, patient examined, no change in status, stable for surgery.  I have reviewed the patient's chart and labs.  Questions were answered to the patient's satisfaction.     Rolm Bookbinder

## 2019-05-27 ENCOUNTER — Encounter (HOSPITAL_COMMUNITY): Payer: Self-pay | Admitting: General Surgery

## 2019-05-27 DIAGNOSIS — C50912 Malignant neoplasm of unspecified site of left female breast: Secondary | ICD-10-CM | POA: Diagnosis not present

## 2019-05-27 DIAGNOSIS — F17210 Nicotine dependence, cigarettes, uncomplicated: Secondary | ICD-10-CM | POA: Diagnosis not present

## 2019-05-27 DIAGNOSIS — Z170 Estrogen receptor positive status [ER+]: Secondary | ICD-10-CM | POA: Diagnosis not present

## 2019-05-27 DIAGNOSIS — Z803 Family history of malignant neoplasm of breast: Secondary | ICD-10-CM | POA: Diagnosis not present

## 2019-05-27 MED ORDER — TRAMADOL HCL 50 MG PO TABS: 50.0000 mg | ORAL_TABLET | Freq: Four times a day (QID) | ORAL | 0 refills | Status: DC | PRN

## 2019-05-27 NOTE — Progress Notes (Signed)
AVS given and reviewed with pt. Medications discussed. RN educated pt about JP drain care. All questions answered to satisfaction. Pt verbalized understanding of information given. Pt escorted off the unit via wheelchair by volunteer services.

## 2019-05-27 NOTE — Plan of Care (Signed)
  Problem: Education: Goal: Knowledge of General Education information will improve Description: Including pain rating scale, medication(s)/side effects and non-pharmacologic comfort measures Outcome: Progressing   Problem: Health Behavior/Discharge Planning: Goal: Ability to manage health-related needs will improve Outcome: Progressing   Problem: Clinical Measurements: Goal: Diagnostic test results will improve Outcome: Progressing Goal: Respiratory complications will improve Outcome: Progressing Goal: Cardiovascular complication will be avoided Outcome: Progressing   Problem: Activity: Goal: Risk for activity intolerance will decrease Outcome: Progressing   Problem: Nutrition: Goal: Adequate nutrition will be maintained Outcome: Progressing   Problem: Coping: Goal: Level of anxiety will decrease Outcome: Progressing   Problem: Pain Managment: Goal: General experience of comfort will improve Outcome: Progressing   Problem: Safety: Goal: Ability to remain free from injury will improve Outcome: Progressing   Problem: Skin Integrity: Goal: Risk for impaired skin integrity will decrease Outcome: Progressing   Problem: Education: Goal: Knowledge of disease or condition will improve Outcome: Progressing   Problem: Activity: Goal: Ability to maintain or regain function will improve Outcome: Progressing   Problem: Pain Management: Goal: Expressions of feelings of enhanced comfort will increase Outcome: Progressing   Problem: Skin Integrity: Goal: Demonstration of wound healing without infection will improve Outcome: Progressing

## 2019-05-27 NOTE — Discharge Summary (Signed)
Physician Discharge Summary  Patient ID: Victoria Richardson MRN: [id number] DOB/AGE: March 07, 1961 58 y.o.  Admit date: 05/26/2019 Discharge date: 05/27/2019  Admission Diagnoses: Left breast cancer  Discharge Diagnoses:  Active Problems:   Breast cancer, left breast [facility])   Discharged Condition: good  Hospital Course: 53 yof s/p left mastectomy and left axillary sentinel node biopsy. Doing well following am with expected drain output, no hematoma, will be discharged home  Consults: None  Significant Diagnostic Studies: none  Treatments: surgery: left mastectomy, left ax sn biopsy  Discharge Exam: Blood pressure (!) 145/74, pulse (!) 59, temperature 98.4 F (36.9 C), temperature source Oral, resp. rate 16, height 5\' 4"  (1.626 m), weight 47.2 kg, SpO2 97 %. Incision/Wound:left chest incision without drainage, no hematoma, drain serosang as expected  Disposition: Discharge disposition: 01-Home or Self Care        Allergies as of 05/27/2019   No Known Allergies     Medication List    TAKE these medications   multivitamin capsule Take 1 capsule by mouth daily.   traMADol 50 MG tablet Commonly known as: ULTRAM Take 1 tablet (50 mg total) by mouth every 6 (six) hours as needed for moderate pain.   VITAMIN C PO Take 1 tablet by mouth daily.      Follow-up Information    Rolm Bookbinder, MD In 1 week.   Specialty: General Surgery Contact information: [place] [address] [phone]           Signed: Rolm Bookbinder 05/27/2019, 5:42 AM

## 2019-05-27 NOTE — Anesthesia Postprocedure Evaluation (Signed)
Anesthesia Post Note  Patient: Victoria Richardson  Procedure(s) Performed: LEFT MASTECTOMY WITH LEFT AXILLARY SENTINEL LYMPH NODE BIOPSY (Left Breast)     Anesthesia Post Evaluation  Last Vitals:  Vitals:   05/27/19 0110 05/27/19 0409  BP: (!) 168/84 (!) 145/74  Pulse: 73 (!) 59  Resp: 16   Temp: 36.6 C 36.9 C  SpO2: 100% 97%    Last Pain:  Vitals:   05/27/19 0409  TempSrc: Oral  PainSc:    Pain Goal: Patients Stated Pain Goal: 4 (05/26/19 1315)                 Pervis Hocking

## 2019-05-29 ENCOUNTER — Telehealth: Payer: Self-pay | Admitting: *Deleted

## 2019-05-29 NOTE — Telephone Encounter (Signed)
Ordered oncotype per Dr. Lindi Adie.  Faxed requisition to pathology.    Also requested pathology to order prognostics on both tumors

## 2019-06-01 NOTE — Progress Notes (Signed)
Patient Care Team: Susy Frizzle, MD as PCP - General (Family Medicine) Mauro Kaufmann, RN as Oncology Nurse Navigator Rockwell Germany, RN as Oncology Nurse Navigator Rolm Bookbinder, MD as Consulting Physician (General Surgery) Nicholas Lose, MD as Consulting Physician (Hematology and Oncology) Kyung Rudd, MD as Consulting Physician (Radiation Oncology)  DIAGNOSIS:    ICD-10-CM   1. Malignant neoplasm of upper-inner quadrant of left breast in female, estrogen receptor positive ([place])  C50.212    Z17.0     SUMMARY OF ONCOLOGIC HISTORY: Oncology History  Malignant neoplasm of upper-inner quadrant of left breast in female, estrogen receptor positive ([place])  05/06/2019 Initial Diagnosis   Routine screening mammogram detected an irregular left breast mass at the 9 o'clock position, 2.5cm, with a 0.5cm ill-defined adjacent mass and 1.0cm line of suspicious calcifications inferior and medial to the primary mass. No evidence of left axillary adenopathy. Biopsy showed IDC, grade 2-3, intermediate grade DCIS, HER-2 - by FISH, ER+ 100%, PR -, Ki67 20%.   05/21/2019 Genetic Testing   Negative genetic testing on the [facility] Common Hereditary Cancers Panel + Renal Cancer Panel. The Common Hereditary Cancers Panel offered by [facility] includes sequencing and/or deletion duplication testing of the following 48 genes: APC, ATM, AXIN2, BARD1, BMPR1A, BRCA1, BRCA2, BRIP1, CDH1, CDKN2A (p14ARF), CDKN2A (p16INK4a), CKD4, CHEK2, CTNNA1, DICER1, EPCAM (Deletion/duplication testing only), GREM1 (promoter region deletion/duplication testing only), KIT, MEN1, MLH1, MSH2, MSH3, MSH6, MUTYH, NBN, NF1, NHTL1, PALB2, PDGFRA, PMS2, POLD1, POLE, PTEN, RAD50, RAD51C, RAD51D, RNF43, SDHB, SDHC, SDHD, SMAD4, SMARCA4. STK11, TP53, TSC1, TSC2, and VHL.  The following genes were evaluated for sequence changes only: SDHA and HOXB13 c.251G>A variant only. The [facility] Renal/Urinary Tract Cancers Panel analyzes the following 24  genes:BAP1 ,CDC73, CDKN1C, DICER1, DIS3L2, EPCAM, FH, FLCN, GPC3, MET, MLH1, MSH2, MSH6, PMS2, PTEN, SDHB, SDHC, SMARCA4, SMARCB1, TP53, TSC1, TSC2, VHL, WT1. The report date is 05/21/2019.   05/26/2019 Surgery   Left mastectomy Victoria Richardson): two malignant tumors, First tumor: IDC grade 2, 2.2cm, and IDC grade 3, 0.5cm, clear margins, 8 lymph nodes negative for carcinoma, ER 100%, PR 0%, Ki-67 5%; second tumor: Grade 3 IDC 0.5 cm, margins negative, 0/2 lymph nodes, ER 100%, PR 5%, Ki-67 30%, HER-2 -1+   06/02/2019 Cancer Staging   Staging form: Breast, AJCC 8th Edition - Pathologic stage from 06/02/2019: Stage IIA (pT2, pN0, cM0, G2, ER+, PR-, HER2-) - Signed by Nicholas Lose, MD on 06/02/2019     CHIEF COMPLIANT: Follow-up s/p mastectomy to review pathology report  INTERVAL HISTORY: Victoria Richardson is a 58 y.o. with above-mentioned history of left breast cancer. Genetic testing was negative. She underwent a mastectomy on 05/26/19 with Dr. Donne Richardson for which pathology confirmed two tumors, IDC grade 2, 2.2cm, and IDC grade 3, 0.5cm, clear margins, 8 lymph nodes negative for carcinoma. She presents to the clinic today to review the pathology report and discuss further treatment.   REVIEW OF SYSTEMS:   Constitutional: Denies fevers, chills or abnormal weight loss Eyes: Denies blurriness of vision Ears, nose, mouth, throat, and face: Denies mucositis or sore throat Respiratory: Denies cough, dyspnea or wheezes Cardiovascular: Denies palpitation, chest discomfort Gastrointestinal: Denies nausea, heartburn or change in bowel habits Skin: Denies abnormal skin rashes Lymphatics: Denies new lymphadenopathy or easy bruising Neurological: Denies numbness, tingling or new weaknesses Behavioral/Psych: Mood is stable, no new changes  Extremities: No lower extremity edema Breast: s/p left mastectomy, discomfort under the arm All other systems were reviewed with the patient and  are negative.  I have  reviewed the past medical history, past surgical history, social history and family history with the patient and they are unchanged from previous note.  ALLERGIES:  has No Known Allergies.  MEDICATIONS:  Current Outpatient Medications  Medication Sig Dispense Refill  . Ascorbic Acid (VITAMIN C PO) Take 1 tablet by mouth daily.     . Multiple Vitamin (MULTIVITAMIN) capsule Take 1 capsule by mouth daily.    . traMADol (ULTRAM) 50 MG tablet Take 1 tablet (50 mg total) by mouth every 6 (six) hours as needed for moderate pain. 10 tablet 0   No current facility-administered medications for this visit.     PHYSICAL EXAMINATION: ECOG PERFORMANCE STATUS: 1 - Symptomatic but completely ambulatory  Vitals:   06/02/19 1155  BP: (!) 143/78  Pulse: (!) 56  Resp: 20  Temp: 98.7 F (37.1 C)  SpO2: 99%   Filed Weights   06/02/19 1155  Weight: 108 lb 12.8 oz (49.4 kg)    GENERAL: alert, no distress and comfortable SKIN: skin color, texture, turgor are normal, no rashes or significant lesions EYES: normal, Conjunctiva are pink and non-injected, sclera clear OROPHARYNX: no exudate, no erythema and lips, buccal mucosa, and tongue normal  NECK: supple, thyroid normal size, non-tender, without nodularity LYMPH: no palpable lymphadenopathy in the cervical, axillary or inguinal LUNGS: clear to auscultation and percussion with normal breathing effort HEART: regular rate & rhythm and no murmurs and no lower extremity edema ABDOMEN: abdomen soft, non-tender and normal bowel sounds MUSCULOSKELETAL: no cyanosis of digits and no clubbing  NEURO: alert & oriented x 3 with fluent speech, no focal motor/sensory deficits EXTREMITIES: No lower extremity edema  LABORATORY DATA:  I have reviewed the data as listed CMP Latest Ref Rng & Units 05/21/2019 05/13/2019 04/02/2019  Glucose 70 - 99 mg/dL 122(H) 100(H) 95  BUN 6 - 20 mg/dL _0 Creatinine 0.44 - 1.00 mg/dL 0.55 0.66 0.54  Sodium 135 - 145 mmol/L 139  144 143  Potassium 3.5 - 5.1 mmol/L 3.0(L) 3.0(LL) 3.9  Chloride 98 - 111 mmol/L 101 103 105  CO2 22 - 32 mmol/L _1 Calcium 8.9 - 10.3 mg/dL 9.1 9.3 9.8  Total Protein 6.5 - 8.1 g/dL - 7.2 7.0  Total Bilirubin 0.3 - 1.2 mg/dL - 0.9 0.8  Alkaline Phos 38 - 126 U/L - 66 -  AST 15 - 41 U/L - 17 10  ALT 0 - 44 U/L - 17 11    Lab Results  Component Value Date   WBC 7.9 05/21/2019   HGB 14.8 05/21/2019   HCT 42.3 05/21/2019   MCV 96.1 05/21/2019   PLT 294 05/21/2019   NEUTROABS 5.1 05/13/2019    ASSESSMENT & PLAN:  Malignant neoplasm of upper-inner quadrant of left breast in female, estrogen receptor positive ([place]) 05/26/2019: Left mastectomy Victoria Richardson): two malignant tumors, First tumor: IDC grade 2, 2.2cm, and IDC grade 3, 0.5cm, clear margins, 8 lymph nodes negative for carcinoma, ER 100%, PR 0%, Ki-67 5%; second tumor: Grade 3 IDC 0.5 cm, margins negative, 0/2 lymph nodes, ER 100%, PR 5%, Ki-67 30%, HER-2 -1+  Pathology counseling: I discussed the final pathology report of the patient provided  a copy of this report. I discussed the margins as well as lymph node surgeries. We also discussed the final staging along with previously performed ER/PR and HER-2/neu testing.  Treatment plan: 1. Oncotype DX testing on both the tumors to determine if chemotherapy  would be of any benefit followed by 2. Adjuvant antiestrogen therapy with anastrozole 1 mg daily x5 to 7 years  Her mom Victoria Richardson is also a patient of mine and breast cancer survivor. Genetic testing: Negative  Return to clinic based upon Oncotype DX test results    No orders of the defined types were placed in this encounter.  The patient has a good understanding of the overall plan. she agrees with it. she will call with any problems that may develop before the next visit here.  Nicholas Lose, MD 06/02/2019  Victoria Richardson am acting as scribe for Dr. Nicholas Lose.  I have reviewed the above documentation  for accuracy and completeness, and I agree with the above.

## 2019-06-02 ENCOUNTER — Other Ambulatory Visit: Payer: Self-pay

## 2019-06-02 ENCOUNTER — Inpatient Hospital Stay: Payer: BC Managed Care – PPO | Attending: Hematology and Oncology | Admitting: Hematology and Oncology

## 2019-06-02 DIAGNOSIS — Z170 Estrogen receptor positive status [ER+]: Secondary | ICD-10-CM | POA: Diagnosis not present

## 2019-06-02 DIAGNOSIS — C50212 Malignant neoplasm of upper-inner quadrant of left female breast: Secondary | ICD-10-CM | POA: Insufficient documentation

## 2019-06-02 NOTE — Assessment & Plan Note (Addendum)
05/26/2019: Left mastectomy [name]): two malignant tumors, First tumor: IDC grade 2, 2.2cm, and IDC grade 3, 0.5cm, clear margins, 8 lymph nodes negative for carcinoma, ER 100%, PR 0%, Ki-67 5%; second tumor: Grade 3 IDC 0.5 cm, margins negative, 0/2 lymph nodes, ER 100%, PR 5%, Ki-67 30%, HER-2 -1+  Pathology counseling: I discussed the final pathology report of the patient provided  a copy of this report. I discussed the margins as well as lymph node surgeries. We also discussed the final staging along with previously performed ER/PR and HER-2/neu testing.  Treatment plan: 1. Oncotype DX testing on both the tumors to determine if chemotherapy would be of any benefit followed by 2. Adjuvant antiestrogen therapy with anastrozole 1 mg daily x5 to 7 years  Her mom Emilio Math is also a patient of mine and breast cancer survivor. Genetic testing: Negative  Return to clinic based upon Oncotype DX test results

## 2019-06-09 ENCOUNTER — Telehealth: Payer: Self-pay | Admitting: *Deleted

## 2019-06-09 NOTE — Telephone Encounter (Signed)
Received call from pt wanting to know if the office received her oncotype results.  Per Arrie Aran, Nurse navigator, expected date for results is 06/15/2019.  Pt updated and verbalized understanding.

## 2019-06-18 ENCOUNTER — Telehealth: Payer: Self-pay | Admitting: *Deleted

## 2019-06-18 MED ORDER — ANASTROZOLE 1 MG PO TABS: 1.0000 mg | ORAL_TABLET | Freq: Every day | ORAL | 6 refills | Status: DC

## 2019-06-18 NOTE — Telephone Encounter (Signed)
Received oncotype score of 20/6%. Physician team notified. Called pt with results and discussed does not need chemo based on this score. Discussed AI and verified pharmacy. Informed next step is SCP with Mendel Ryder. Denies further questions or needs.

## 2019-06-19 ENCOUNTER — Encounter (HOSPITAL_COMMUNITY): Payer: Self-pay | Admitting: Hematology and Oncology

## 2019-06-22 ENCOUNTER — Ambulatory Visit: Payer: BC Managed Care – PPO | Attending: General Surgery | Admitting: Physical Therapy

## 2019-06-22 ENCOUNTER — Encounter: Payer: Self-pay | Admitting: Physical Therapy

## 2019-06-22 ENCOUNTER — Other Ambulatory Visit: Payer: Self-pay

## 2019-06-22 ENCOUNTER — Telehealth: Payer: Self-pay | Admitting: Hematology and Oncology

## 2019-06-22 DIAGNOSIS — Z170 Estrogen receptor positive status [ER+]: Secondary | ICD-10-CM | POA: Diagnosis not present

## 2019-06-22 DIAGNOSIS — C50312 Malignant neoplasm of lower-inner quadrant of left female breast: Secondary | ICD-10-CM | POA: Insufficient documentation

## 2019-06-22 DIAGNOSIS — Z483 Aftercare following surgery for neoplasm: Secondary | ICD-10-CM | POA: Insufficient documentation

## 2019-06-22 DIAGNOSIS — M25612 Stiffness of left shoulder, not elsewhere classified: Secondary | ICD-10-CM | POA: Insufficient documentation

## 2019-06-22 DIAGNOSIS — R293 Abnormal posture: Secondary | ICD-10-CM | POA: Diagnosis not present

## 2019-06-22 NOTE — Telephone Encounter (Signed)
Scheduled appt per 10/1 sch message - pt aware of appt date and time

## 2019-06-22 NOTE — Therapy (Addendum)
[place], [place], [address] Phone: [phone]   Fax:  [phone]  Physical Therapy Treatment  Patient Details  Name: Victoria Richardson MRN: [id number] Date of Birth: 21-May-1961 Referring Provider (PT): Dr. Rolm Bookbinder   Encounter Date: 06/22/2019  PT End of Session - 06/22/19 1347    Visit Number  2    Number of Visits  10    Date for PT Re-Evaluation  07/20/19    PT Start Time  1300    PT Stop Time  1348    PT Time Calculation (min)  48 min    Activity Tolerance  Patient tolerated treatment well    Behavior During Therapy  [facility] for tasks assessed/performed       Past Medical History:  Diagnosis Date  . Allergy   . Breast cancer in female [facility])    Left  . Diverticulitis   . Family history of adverse reaction to anesthesia    Mom has post-op n/v  . Family history of breast cancer   . Family history of colon cancer   . Family history of kidney cancer   . Family history of prostate cancer     Past Surgical History:  Procedure Laterality Date  . ADENOIDECTOMY    . COLONOSCOPY    . DILATION AND CURETTAGE OF UTERUS    . MASTECTOMY W/ SENTINEL NODE BIOPSY Left 05/26/2019   Procedure: LEFT MASTECTOMY WITH LEFT AXILLARY SENTINEL LYMPH NODE BIOPSY;  Surgeon: Rolm Bookbinder, MD;  Location: [place];  Service: General;  Laterality: Left;  . tubaligation      There were no vitals filed for this visit.  Subjective Assessment - 06/22/19 1306    Subjective  She underwent a left mastectomy and 8 axillary lymph nodes removed on 05/26/2019. All nodes were negative. There's no need for chemo or radiation. She smokes 1/2 pack per day.    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 She underwent a left mastectomy and 8 axillary lymph nodes removed on 05/26/2019. All nodes were negative. It is ER positive, PR negative, and HER2  negative with a Ki67 of 20%. She smokes 1 pack per day.    Patient Stated Goals  See if my arm is ok    Currently in Pain?  Yes    Pain Score  3     Pain Location  Axilla    Pain Orientation  Left    Pain Descriptors / Indicators  Tightness    Pain Type  Surgical pain    Pain Onset  1 to 4 weeks ago    Pain Frequency  Intermittent    Aggravating Factors   Reaching overhead    Pain Relieving Factors  Rest         [facility] PT Assessment - 06/22/19 0001      Assessment   Medical Diagnosis  s/p left mastectomy and SLNB    Referring Provider (PT)  Dr. Rolm Bookbinder    Onset Date/Surgical Date  05/26/19    Hand Dominance  Right    Prior Therapy  Baselines      Precautions   Precautions  Other (comment)    Precaution Comments  recent surgery      Restrictions   Weight Bearing Restrictions  No      Balance Screen   Has the patient fallen in the past 6 months  No  Has the patient had a decrease in activity level because of a fear of falling?   No    Is the patient reluctant to leave their home because of a fear of falling?   No      Home Social worker  Private residence    Living Arrangements  Spouse/significant other    Available Help at Discharge  Family      Prior Function   Level of Independence  Independent    Vocation  Full time employment    Vocation Requirements  Works at [employer] at [employer] - very physical work. She will return at the end of November.    Leisure  She does not exercise      Cognition   Overall Cognitive Status  Within Functional Limits for tasks assessed      Observation/Other Assessments   Observations  Significant cording prestn in left axilla; see photo. Incision appear to be healing well. Good scar mobility present. Slight edema present in left latissimus region but not significant.       Posture/Postural Control   Posture/Postural Control  Postural limitations    Postural Limitations  Rounded Shoulders;Forward  head;Increased thoracic kyphosis      ROM / Strength   AROM / PROM / Strength  AROM      AROM   AROM Assessment Site  Shoulder    Right/Left Shoulder  Left    Left Shoulder Extension  54 Degrees    Left Shoulder Flexion  120 Degrees    Left Shoulder ABduction  139 Degrees    Left Shoulder Internal Rotation  60 Degrees    Left Shoulder External Rotation  78 Degrees      Strength   Overall Strength  Within functional limits for tasks performed          LYMPHEDEMA/ONCOLOGY QUESTIONNAIRE - 06/22/19 1312      Type   Cancer Type  Left breast cancer      Surgeries   Mastectomy Date  05/26/19    Sentinel Lymph Node Biopsy Date  05/26/19    Number Lymph Nodes Removed  8      Treatment   Active Chemotherapy Treatment  No    Past Chemotherapy Treatment  No    Active Radiation Treatment  No    Past Radiation Treatment  No    Current Hormone Treatment  Yes    Date  06/18/19    Drug Name  Arimidex    Past Hormone Therapy  No      What other symptoms do you have   Are you Having Heaviness or Tightness  Yes    Are you having Pain  Yes    Are you having pitting edema  No    Is it Hard or Difficult finding clothes that fit  No    Do you have infections  No    Is there Decreased scar mobility  Yes    [place] Sign  No      Lymphedema Assessments   Lymphedema Assessments  Upper extremities      Right Upper Extremity Lymphedema   10 cm Proximal to Olecranon Process  22.1 cm    Olecranon Process  21.7 cm    10 cm Proximal to Ulnar Styloid Process  18.8 cm    Just Proximal to Ulnar Styloid Process  14 cm    Across Hand at [employer]  17.7 cm    At [place] of 2nd  Digit  5.8 cm      Left Upper Extremity Lymphedema   10 cm Proximal to Olecranon Process  21.6 cm    Olecranon Process  21.3 cm    10 cm Proximal to Ulnar Styloid Process  17.4 cm    Just Proximal to Ulnar Styloid Process  13.8 cm    Across Hand at [employer]  16.9 cm    At [place] of 2nd Digit  5.8 cm         Quick Dash - 06/22/19 0001    Open a tight or new jar  Moderate difficulty    Do heavy household chores (wash walls, wash floors)  No difficulty    Carry a shopping bag or briefcase  Mild difficulty    Wash your back  No difficulty    Use a knife to cut food  No difficulty    Recreational activities in which you take some force or impact through your arm, shoulder, or hand (golf, hammering, tennis)  Moderate difficulty    During the past week, to what extent has your arm, shoulder or hand problem interfered with your normal social activities with family, friends, neighbors, or groups?  Slightly    During the past week, to what extent has your arm, shoulder or hand problem limited your work or other regular daily activities  Slightly    Arm, shoulder, or hand pain.  Mild    Tingling (pins and needles) in your arm, shoulder, or hand  None    Difficulty Sleeping  Moderate difficulty    DASH Score  22.73 %             [facility] Adult PT Treatment/Exercise - 06/22/19 0001      Manual Therapy   Manual Therapy  Myofascial release;Passive ROM    Myofascial Release  To left upper medial arm and axilla in supine to try to reduce cording    Passive ROM  To left shoulder in supine flexion and abduction to pt tolerance.             PT Education - 06/22/19 1346    Education Details  Median nerve stretch    Person(s) Educated  Patient    Methods  Explanation;Demonstration;Handout    Comprehension  Returned demonstration;Verbalized understanding          PT Long Term Goals - 06/22/19 1358      PT LONG TERM GOAL #1   Title  Patient will demonstrate she has regained full shoulder ROM and function post operatively compared to baselines.    Time  8    Period  Weeks    Status  On-going      PT LONG TERM GOAL #2   Title  Patient will increase left shoulder active flexion to >/= 145 degrees for increased ease reaching overhead.    Baseline  120 at re-eval but 149 pre-op    Time   4    Period  Weeks    Status  New    Target Date  07/20/19      PT LONG TERM GOAL #3   Title  Patient will increase left shoulder active abduction to >/= 160 degrees for increased ease reaching overhead.    Baseline  139 at re-eval but 167 pre-op    Time  4    Period  Weeks    Status  New    Target Date  07/20/19      PT LONG TERM GOAL #  4   Title  Patient will decrease DASH score to </= 5 for improved overall upper extremity function    Baseline  22.73 post op but 2.27 pre-op    Time  4    Period  Weeks    Status  New    Target Date  07/20/19      PT LONG TERM GOAL #5   Title  Patient will verbalize good understanding of lymphedema risk reduction practices.    Time  4    Period  Weeks    Status  New            Plan - 06/22/19 1348    Clinical Impression Statement  Patient is doing very well s/p left mastectomy and sentinel node biopsy on 05/26/2019. She had 8 axillary nodes removed, all were negative. No other treatment is needed except to take Arimidex. She is doing well overall but has significant cording present in her left axilla extending into her lower medial upper arm. Her incision is well healed and she has no signs of lymphedema. She will benefit from PT to reduce cording and improve shoulder ROM.    Rehab Potential  Excellent    PT Frequency  2x / week    PT Duration  4 weeks    PT Treatment/Interventions  ADLs/Self Care Home Management;Therapeutic exercise;Patient/family education;Manual techniques;Manual lymph drainage;Passive range of motion    PT Next Visit Plan  PROM and manual techniques to reduce cording and improve left shoulder ROM    PT Home Exercise Plan  Post op shoulder ROM HEP and neural stretch    Consulted and Agree with Plan of Care  Patient       Patient will benefit from skilled therapeutic intervention in order to improve the following deficits and impairments:  Postural dysfunction, Decreased range of motion, Pain, Impaired UE functional  use, Decreased knowledge of precautions, Increased fascial restricitons  Visit Diagnosis: Malignant neoplasm of lower-inner quadrant of left breast in female, estrogen receptor positive ([place]) - Plan: PT plan of care cert/re-cert  Abnormal posture - Plan: PT plan of care cert/re-cert  Aftercare following surgery for neoplasm - Plan: PT plan of care cert/re-cert  Stiffness of left shoulder, not elsewhere classified - Plan: PT plan of care cert/re-cert     Problem List Patient Active Problem List   Diagnosis Date Noted  . Breast cancer, left breast ([place]) 05/26/2019  . Genetic testing 05/22/2019  . Family history of breast cancer   . Family history of colon cancer   . Family history of prostate cancer   . Family history of kidney cancer   . Malignant neoplasm of upper-inner quadrant of left breast in female, estrogen receptor positive ([place]) 05/06/2019  . Family history of breast cancer in mother 04/02/2019  . Weight loss 04/02/2019  . Abnormal TSH 04/02/2019  . Diverticulitis 04/02/2019  . Aortic atherosclerosis ([place]) 04/02/2019  . Smoker 01/30/2016  . Encounter for smoking cessation counseling 01/30/2016  . HYPERLIPIDEMIA 12/15/2007  . CERVICAL POLYP 12/15/2007  . RECTAL BLEEDING, HX OF 12/15/2007   Annia Friendly, PT 06/22/19 2:03 PM   [place], [place], [address] Phone: [phone]   Fax:  [phone]  Name: Willadean Guyton MRN: [id number] Date of Birth: 1961-03-18

## 2019-06-22 NOTE — Patient Instructions (Signed)
MEDIAN NERVE: Mobilization XI    Stand with right palm flat on wall, fingers back, elbow bent, head tilted away. Sidestep away from wall, straightening elbow. Do _1 sets of _5__ repetitions per session. Hold for 5 seconds. Do _3__ sessions per day.  Copyright  VHI. All rights reserved.

## 2019-06-23 ENCOUNTER — Encounter: Payer: Self-pay | Admitting: *Deleted

## 2019-06-25 ENCOUNTER — Encounter: Payer: Self-pay | Admitting: Rehabilitation

## 2019-06-25 ENCOUNTER — Other Ambulatory Visit: Payer: Self-pay

## 2019-06-25 ENCOUNTER — Ambulatory Visit: Payer: BC Managed Care – PPO | Admitting: Rehabilitation

## 2019-06-25 DIAGNOSIS — Z170 Estrogen receptor positive status [ER+]: Secondary | ICD-10-CM | POA: Diagnosis not present

## 2019-06-25 DIAGNOSIS — R293 Abnormal posture: Secondary | ICD-10-CM | POA: Diagnosis not present

## 2019-06-25 DIAGNOSIS — Z483 Aftercare following surgery for neoplasm: Secondary | ICD-10-CM

## 2019-06-25 DIAGNOSIS — C50312 Malignant neoplasm of lower-inner quadrant of left female breast: Secondary | ICD-10-CM

## 2019-06-25 DIAGNOSIS — M25612 Stiffness of left shoulder, not elsewhere classified: Secondary | ICD-10-CM | POA: Diagnosis not present

## 2019-06-25 NOTE — Patient Instructions (Signed)
Scar Massage  Scar massage is done to improve the mobility of scar, decrease scar tissue from building up, reduce adhesions, and prevent Keloids from forming. Start scar massage after scabs have fallen off by themselves and no open areas. The first few weeks after surgery, it is normal for a scar to appear pink or red and slightly raised. Scars can itch or have areas of numbness. Some scars may be sensitive.   Direct Scar massage: after scar is healed, no opening, no scab 1.  Place pads of two fingers together directly on the scar starting at one end of the scar. Move the fingers up and down across the scar holding 5 seconds one direction.  Then go opposite direction hold 5 seconds.  2. Move over to the next section of the scar and repeat.  Work your way along the entire length of the scar.   3. Next make diagonal movements along the scar holding 5 seconds at one direction. 4. Next movement is side to side. 5. Do not rub fingers over the scar.  Instead keep firm pressure and move scar over the tissue it is on top   Scar Lift and Roll 12 weeks after surgery. 1. Pinch a small amount of the scar between your first two fingers and thumb.  2. Roll the scar between your fingers for 5 to 15 seconds. 3. Move along the scar and repeat until you have massaged the entire length of scar.   Stop the massage and call your doctor if you notice: 1. Increased redness 2. Bleeding from scar 3. Seepage coming from the scar 4. Scar is warmer and has increased pain    Axillary web syndrome (also called cording) can happen after having breast cancer surgery when lymph nodes in the armpit are removed. It presents as if you have a thin cord in your arm and can run from the armpit all the way down into the forearm. If you've had a sentinel node biopsy, the risk is 1-20% and if you've had an axillary lymph node dissection (more than 7 nodes removed), the risk is 36-72%. The ranges vary depending on the research study.   It most often happens 3-4 weeks post-op but can happen sooner or later. There are several possibilities for what cording actually is. Although no one knows for sure as of yet, it may be related to lymphatics, veins, or other tissue. Sometimes cording resolves on its own but other times it requires physical therapy with a therapist who specializes in lymphedema and/or cancer rehab. Treatment typically involves stretching, manual techniques, and exercise. Sometimes cords get "released" while stretching or during manual treatment and the patient may experience the sensation of a "pop." This may feel strange but it is not dangerous and is a sign that the cord has released; range of motion may be improved in the process.

## 2019-06-25 NOTE — Therapy (Signed)
[place], [place], [address] Phone: [phone]   Fax:  [phone]  Physical Therapy Treatment  Patient Details  Name: Victoria Richardson MRN: [id number] Date of Birth: 1961/05/31 Referring Provider (PT): Dr. Rolm Bookbinder   Encounter Date: 06/25/2019  PT End of Session - 06/25/19 1436    Visit Number  3    Number of Visits  10    Date for PT Re-Evaluation  07/20/19    PT Start Time  1400    PT Stop Time  1444    PT Time Calculation (min)  44 min    Activity Tolerance  Patient tolerated treatment well    Behavior During Therapy  [facility] for tasks assessed/performed       Past Medical History:  Diagnosis Date  . Allergy   . Breast cancer in female [facility])    Left  . Diverticulitis   . Family history of adverse reaction to anesthesia    Mom has post-op n/v  . Family history of breast cancer   . Family history of colon cancer   . Family history of kidney cancer   . Family history of prostate cancer     Past Surgical History:  Procedure Laterality Date  . ADENOIDECTOMY    . COLONOSCOPY    . DILATION AND CURETTAGE OF UTERUS    . MASTECTOMY W/ SENTINEL NODE BIOPSY Left 05/26/2019   Procedure: LEFT MASTECTOMY WITH LEFT AXILLARY SENTINEL LYMPH NODE BIOPSY;  Surgeon: Rolm Bookbinder, MD;  Location: [place];  Service: General;  Laterality: Left;  . tubaligation      There were no vitals filed for this visit.  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 She underwent a left mastectomy and 8 axillary lymph nodes removed on 05/26/2019. All nodes were negative. It is ER positive, PR negative, and HER2 negative with a Ki67 of 20%. She smokes 1 pack per day.    Currently in Pain?  No/denies         [facility] PT Assessment - 06/25/19 0001      AROM   Left  Shoulder Flexion  142 Degrees   tight in axilla   Left Shoulder ABduction  147 Degrees                   [facility] Adult PT Treatment/Exercise - 06/25/19 0001      Exercises   Exercises  Shoulder      Shoulder Exercises: Pulleys   Flexion  2 minutes    Flexion Limitations  with instruction    ABduction  2 minutes    ABduction Limitations  with instruction      Shoulder Exercises: Therapy Ball   Flexion  5 reps;Both    Flexion Limitations  with instruction for initial performance      Manual Therapy   Manual Therapy  Soft tissue mobilization    Soft tissue mobilization  gently into the left pectoralis border at the axilla     Myofascial Release  To left upper medial arm and axilla in supine to try to reduce cording    Passive ROM  to the left shoulder into flexion, abduction, ER at various angles and into gentle pectoralis stretch             PT Education - 06/25/19 1439  Education Details  scar massage    Person(s) Educated  Patient    Methods  Explanation;Demonstration;Tactile cues;Verbal cues;Handout    Comprehension  Verbalized understanding;Returned demonstration          PT Long Term Goals - 06/22/19 1358      PT LONG TERM GOAL #1   Title  Patient will demonstrate she has regained full shoulder ROM and function post operatively compared to baselines.    Time  8    Period  Weeks    Status  On-going      PT LONG TERM GOAL #2   Title  Patient will increase left shoulder active flexion to >/= 145 degrees for increased ease reaching overhead.    Baseline  120 at re-eval but 149 pre-op    Time  4    Period  Weeks    Status  New    Target Date  07/20/19      PT LONG TERM GOAL #3   Title  Patient will increase left shoulder active abduction to >/= 160 degrees for increased ease reaching overhead.    Baseline  139 at re-eval but 167 pre-op    Time  4    Period  Weeks    Status  New    Target Date  07/20/19      PT LONG TERM GOAL #4   Title   Patient will decrease DASH score to </= 5 for improved overall upper extremity function    Baseline  22.73 post op but 2.27 pre-op    Time  4    Period  Weeks    Status  New    Target Date  07/20/19      PT LONG TERM GOAL #5   Title  Patient will verbalize good understanding of lymphedema risk reduction practices.    Time  4    Period  Weeks    Status  New            Plan - 06/25/19 1436    Clinical Impression Statement  Pt already with improvement into flexion and abduction AROM from last visit and pt reports that the cording has decreased even since the first treatment.  Continued left shoulder PROM and STM/myofasical release of cording more in the upper arm and axilla today.  Educated pt on scar massage and performed this briefly.    PT Frequency  2x / week    PT Duration  4 weeks    PT Treatment/Interventions  ADLs/Self Care Home Management;Therapeutic exercise;Patient/family education;Manual techniques;Manual lymph drainage;Passive range of motion    PT Next Visit Plan  PROM and manual techniques to reduce cording and improve left shoulder ROM, advance AAROM standing dowel    PT Home Exercise Plan  Post op shoulder ROM HEP and neural stretch       Patient will benefit from skilled therapeutic intervention in order to improve the following deficits and impairments:  Postural dysfunction, Decreased range of motion, Pain, Impaired UE functional use, Decreased knowledge of precautions, Increased fascial restricitons  Visit Diagnosis: Malignant neoplasm of lower-inner quadrant of left breast in female, estrogen receptor positive (HCC)  Abnormal posture  Aftercare following surgery for neoplasm  Stiffness of left shoulder, not elsewhere classified     Problem List Patient Active Problem List   Diagnosis Date Noted  . Breast cancer, left breast ([place]) 05/26/2019  . Genetic testing 05/22/2019  . Family history of breast cancer   . Family history of colon cancer   .  Family history of prostate cancer   . Family history of kidney cancer   . Malignant neoplasm of upper-inner quadrant of left breast in female, estrogen receptor positive ([place]) 05/06/2019  . Family history of breast cancer in mother 04/02/2019  . Weight loss 04/02/2019  . Abnormal TSH 04/02/2019  . Diverticulitis 04/02/2019  . Aortic atherosclerosis ([place]) 04/02/2019  . Smoker 01/30/2016  . Encounter for smoking cessation counseling 01/30/2016  . HYPERLIPIDEMIA 12/15/2007  . CERVICAL POLYP 12/15/2007  . RECTAL BLEEDING, HX OF 12/15/2007    Stark Bray 06/25/2019, 2:45 PM  [place], [place], [address] Phone: [phone]   Fax:  [phone]  Name: Victoria Richardson MRN: [id number] Date of Birth: [birth year]

## 2019-06-29 ENCOUNTER — Ambulatory Visit: Payer: BC Managed Care – PPO

## 2019-06-29 ENCOUNTER — Other Ambulatory Visit: Payer: Self-pay

## 2019-06-29 DIAGNOSIS — C50312 Malignant neoplasm of lower-inner quadrant of left female breast: Secondary | ICD-10-CM | POA: Diagnosis not present

## 2019-06-29 DIAGNOSIS — M25612 Stiffness of left shoulder, not elsewhere classified: Secondary | ICD-10-CM | POA: Diagnosis not present

## 2019-06-29 DIAGNOSIS — Z170 Estrogen receptor positive status [ER+]: Secondary | ICD-10-CM | POA: Diagnosis not present

## 2019-06-29 DIAGNOSIS — R293 Abnormal posture: Secondary | ICD-10-CM

## 2019-06-29 DIAGNOSIS — Z483 Aftercare following surgery for neoplasm: Secondary | ICD-10-CM | POA: Diagnosis not present

## 2019-06-29 NOTE — Patient Instructions (Signed)
Access Code: [email or web address]  URL: [email or web address]  Date: 06/29/2019  Prepared by: Tomma Rakers   Exercises Standing Shoulder Abduction AAROM with Dowel - 10 reps - 1 sets - 5 seconds hold - 1x daily - 7x weekly Standing Shoulder Flexion AAROM with Dowel - 10 reps - 1 sets - 5 seconds hold - 1x daily - 7x weekly Shoulder Flexion Wall Slide with Towel - 10 reps - 1 sets - 5 seconds hold - 1x daily - 7x weekly Standing Shoulder Abduction Slides at Wall - 10 reps - 1 sets - 5 seconds hold - 1x daily - 7x weekly

## 2019-06-29 NOTE — Therapy (Signed)
[place], [place], [address] Phone: [phone]   Fax:  [phone]  Physical Therapy Treatment  Patient Details  Name: Victoria Richardson MRN: [id number] Date of Birth: 1961-05-25 Referring Provider (PT): Dr. Rolm Bookbinder   Encounter Date: 06/29/2019  PT End of Session - 06/29/19 1306    Visit Number  4    Number of Visits  10    Date for PT Re-Evaluation  07/20/19    PT Start Time  1300    PT Stop Time  1345    PT Time Calculation (min)  45 min    Activity Tolerance  Patient tolerated treatment well    Behavior During Therapy  [facility] for tasks assessed/performed       Past Medical History:  Diagnosis Date  . Allergy   . Breast cancer in female [facility])    Left  . Diverticulitis   . Family history of adverse reaction to anesthesia    Mom has post-op n/v  . Family history of breast cancer   . Family history of colon cancer   . Family history of kidney cancer   . Family history of prostate cancer     Past Surgical History:  Procedure Laterality Date  . ADENOIDECTOMY    . COLONOSCOPY    . DILATION AND CURETTAGE OF UTERUS    . MASTECTOMY W/ SENTINEL NODE BIOPSY Left 05/26/2019   Procedure: LEFT MASTECTOMY WITH LEFT AXILLARY SENTINEL LYMPH NODE BIOPSY;  Surgeon: Rolm Bookbinder, MD;  Location: [place];  Service: General;  Laterality: Left;  . tubaligation      There were no vitals filed for this visit.  Subjective Assessment - 06/29/19 1302    Subjective  Pt reports that she has been feeling pretty good just tight from the cording and is not able to reach as high as she would like to reach.    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 She underwent a left mastectomy and 8 axillary lymph nodes removed on 05/26/2019. All nodes were negative. It is ER positive, PR negative, and HER2 negative with a Ki67 of 20%. She  smokes 1 pack per day.    Patient Stated Goals  See if my arm is ok    Currently in Pain?  No/denies    Pain Onset  1 to 4 weeks ago    Pain Frequency  Intermittent    Aggravating Factors   reaching over head    Pain Relieving Factors  rest                       [facility] Adult PT Treatment/Exercise - 06/29/19 0001      Shoulder Exercises: Supine   Other Supine Exercises  Chest press 10x/Chest prestt w/flexion 10x VC to avoid painful stretching    Other Supine Exercises  Supine ER w/dowel for LUE 10x VC and tactile cueing for form.       Shoulder Exercises: Seated   Flexion  AAROM;10 reps    Flexion Weight (lbs)  10x flexion w/dowel for AAROM VC to avoid pain and prevent protraction of the cervical spine.     Abduction  AAROM;Left;10 reps    ABduction Weight (lbs)  VC for hand placement on dowel for AAROM      Shoulder Exercises: Standing   Other Standing Exercises  shoulder flexion wall slide 10x 5 second hold VC for  no pain with stretching    Other Standing Exercises  Shoulder abd wall slide 10x 5 second hold VC for no pain with stretching      Manual Therapy   Manual Therapy  Soft tissue mobilization;Myofascial release;Passive ROM;Manual Lymphatic Drainage (MLD)    Soft tissue mobilization  gently into the left pectoralis major, deltoid and axilla    Myofascial Release  To Left pectoralis major, left upper medial arm and axilla in supine to try to reduce cording    Manual Lymphatic Drainage (MLD)  Bil axillary lymph nodes, Bil inguinal lymph nodes, anterior inter-axillary anastomosis to the R and R axillo-inguinal anastomosis    Passive ROM  to the left shoulder into flexion, abduction, ER at various angles and into gentle pectoralis stretch with intermittent myofascial release at end range.              PT Education - 06/29/19 1318    Education Details  Access Code: [email or web address], re-iterated education on scar massage and discussed the importance of performing long  easy stretches rather than short painful stretches. Pt was educated on standing AAROM exercises    Person(s) Educated  Patient    Methods  Explanation;Demonstration;Tactile cues;Verbal cues;Handout    Comprehension  Verbalized understanding;Returned demonstration          PT Long Term Goals - 06/22/19 1358      PT LONG TERM GOAL #1   Title  Patient will demonstrate she has regained full shoulder ROM and function post operatively compared to baselines.    Time  8    Period  Weeks    Status  On-going      PT LONG TERM GOAL #2   Title  Patient will increase left shoulder active flexion to >/= 145 degrees for increased ease reaching overhead.    Baseline  120 at re-eval but 149 pre-op    Time  4    Period  Weeks    Status  New    Target Date  07/20/19      PT LONG TERM GOAL #3   Title  Patient will increase left shoulder active abduction to >/= 160 degrees for increased ease reaching overhead.    Baseline  139 at re-eval but 167 pre-op    Time  4    Period  Weeks    Status  New    Target Date  07/20/19      PT LONG TERM GOAL #4   Title  Patient will decrease DASH score to </= 5 for improved overall upper extremity function    Baseline  22.73 post op but 2.27 pre-op    Time  4    Period  Weeks    Status  New    Target Date  07/20/19      PT LONG TERM GOAL #5   Title  Patient will verbalize good understanding of lymphedema risk reduction practices.    Time  4    Period  Weeks    Status  New            Plan - 06/29/19 1306    Clinical Impression Statement  Pt presents to physical therapy with continued ROM deficits in the L shoulder; slight improvement following easy PROM w/easy over pressure avoiding pain. She was able to tolerate an increase in difficulty this session w/activities w/o an increase in pain. She did require VC throughout for comfotable stretches to avoid pain. Palpable tightness and cording continues on the L anterior  chest wall and L axilla; slight  improvement in skin mobility following STM/scar mobilization. Pt will benefit from continued POC.    PT Frequency  2x / week    PT Duration  4 weeks    PT Treatment/Interventions  ADLs/Self Care Home Management;Therapeutic exercise;Patient/family education;Manual techniques;Manual lymph drainage;Passive range of motion    PT Next Visit Plan  Continue with PROM and manual techniques. Assess HEP    PT Home Exercise Plan  Access Code: [email or web address]    Consulted and Agree with Plan of Care  Patient       Patient will benefit from skilled therapeutic intervention in order to improve the following deficits and impairments:  Postural dysfunction, Decreased range of motion, Pain, Impaired UE functional use, Decreased knowledge of precautions, Increased fascial restricitons  Visit Diagnosis: Malignant neoplasm of lower-inner quadrant of left breast in female, estrogen receptor positive (HCC)  Abnormal posture  Aftercare following surgery for neoplasm  Stiffness of left shoulder, not elsewhere classified     Problem List Patient Active Problem List   Diagnosis Date Noted  . Breast cancer, left breast ([place]) 05/26/2019  . Genetic testing 05/22/2019  . Family history of breast cancer   . Family history of colon cancer   . Family history of prostate cancer   . Family history of kidney cancer   . Malignant neoplasm of upper-inner quadrant of left breast in female, estrogen receptor positive ([place]) 05/06/2019  . Family history of breast cancer in mother 04/02/2019  . Weight loss 04/02/2019  . Abnormal TSH 04/02/2019  . Diverticulitis 04/02/2019  . Aortic atherosclerosis ([place]) 04/02/2019  . Smoker 01/30/2016  . Encounter for smoking cessation counseling 01/30/2016  . HYPERLIPIDEMIA 12/15/2007  . CERVICAL POLYP 12/15/2007  . RECTAL BLEEDING, HX OF 12/15/2007    Ander Purpura, PT 06/29/2019, 2:35 PM  [place], [place], [address] Phone: [phone]   Fax:  [phone]  Name: Victoria Richardson MRN: [id number] Date of Birth: [birth year]

## 2019-07-06 ENCOUNTER — Ambulatory Visit: Payer: BC Managed Care – PPO | Admitting: Rehabilitation

## 2019-07-06 ENCOUNTER — Encounter: Payer: Self-pay | Admitting: Rehabilitation

## 2019-07-06 ENCOUNTER — Other Ambulatory Visit: Payer: Self-pay

## 2019-07-06 DIAGNOSIS — Z483 Aftercare following surgery for neoplasm: Secondary | ICD-10-CM | POA: Diagnosis not present

## 2019-07-06 DIAGNOSIS — R293 Abnormal posture: Secondary | ICD-10-CM | POA: Diagnosis not present

## 2019-07-06 DIAGNOSIS — Z170 Estrogen receptor positive status [ER+]: Secondary | ICD-10-CM | POA: Diagnosis not present

## 2019-07-06 DIAGNOSIS — C50312 Malignant neoplasm of lower-inner quadrant of left female breast: Secondary | ICD-10-CM | POA: Diagnosis not present

## 2019-07-06 DIAGNOSIS — M25612 Stiffness of left shoulder, not elsewhere classified: Secondary | ICD-10-CM | POA: Diagnosis not present

## 2019-07-06 NOTE — Therapy (Addendum)
[place], [place], [address] Phone: [phone]   Fax:  [phone]  Physical Therapy Treatment  Patient Details  Name: Victoria Richardson MRN: [id number] Date of Birth: 08/28/1961 Referring Provider (PT): Dr. Rolm Bookbinder   Encounter Date: 07/06/2019  PT End of Session - 07/06/19 1646    Visit Number  5    Number of Visits  10    Date for PT Re-Evaluation  07/20/19    PT Start Time  1600    PT Stop Time  1646    PT Time Calculation (min)  46 min    Activity Tolerance  Patient tolerated treatment well    Behavior During Therapy  [facility] for tasks assessed/performed       Past Medical History:  Diagnosis Date  . Allergy   . Breast cancer in female [facility])    Left  . Diverticulitis   . Family history of adverse reaction to anesthesia    Mom has post-op n/v  . Family history of breast cancer   . Family history of colon cancer   . Family history of kidney cancer   . Family history of prostate cancer     Past Surgical History:  Procedure Laterality Date  . ADENOIDECTOMY    . COLONOSCOPY    . DILATION AND CURETTAGE OF UTERUS    . MASTECTOMY W/ SENTINEL NODE BIOPSY Left 05/26/2019   Procedure: LEFT MASTECTOMY WITH LEFT AXILLARY SENTINEL LYMPH NODE BIOPSY;  Surgeon: Rolm Bookbinder, MD;  Location: [place];  Service: General;  Laterality: Left;  . tubaligation      There were no vitals filed for this visit.  Subjective Assessment - 07/06/19 1603    Subjective  I am doing well.  I feel 85% .  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 She underwent a left mastectomy and 8 axillary lymph nodes removed on 05/26/2019. All nodes were negative. It is ER positive, PR negative, and HER2 negative with a Ki67 of 20%. She smokes 1 pack per day.    Currently in Pain?  No/denies         [facility]  PT Assessment - 07/06/19 0001      AROM   Right Shoulder Horizontal ABduction  50 Degrees    Left Shoulder Flexion  150 Degrees   pull in axilla   Left Shoulder ABduction  160 Degrees   with pull   Left Shoulder Internal Rotation  86 Degrees    Left Shoulder External Rotation  88 Degrees    Left Shoulder Horizontal ABduction  22 Degrees                   [facility] Adult PT Treatment/Exercise - 07/06/19 0001      Shoulder Exercises: Supine   Protraction  10 reps;Both    Protraction Weight (lbs)  2    Other Supine Exercises  chest press 2# x 10 bil, chest press with flexion 2# x 5,       Shoulder Exercises: Sidelying   External Rotation  Left;10 reps    ABduction  Left;10 reps      Shoulder Exercises: Stretch   Other Shoulder Stretches  wall stretch 3x15", tricep stretch 3x15" , doorway stretch 3x20",       Manual Therapy   Soft tissue mobilization  gently into the left pectoralis major, deltoid  and axilla    Myofascial Release  To Left pectoralis major, left upper medial arm and axilla in supine to try to reduce cording    Passive ROM  to the left shoulder into flexion, abduction, ER at various angles and into gentle pectoralis stretch with intermittent myofascial release at end range.                   PT Long Term Goals - 07/06/19 1650      PT LONG TERM GOAL #1   Title  Patient will demonstrate she has regained full shoulder ROM and function post operatively compared to baselines.    Status  Partially Met      PT LONG TERM GOAL #2   Title  Patient will increase left shoulder active flexion to >/= 145 degrees for increased ease reaching overhead.    Baseline  150 on 07/06/19    Status  Achieved      PT LONG TERM GOAL #3   Title  Patient will increase left shoulder active abduction to >/= 160 degrees for increased ease reaching overhead.    Baseline  139 at re-eval but 167 pre-op, 160 on 10/19    Status  Achieved      PT LONG TERM GOAL #4   Title   Patient will decrease DASH score to </= 5 for improved overall upper extremity function    Status  On-going      PT LONG TERM GOAL #5   Title  Patient will verbalize good understanding of lymphedema risk reduction practices.    Baseline  discussed garments for work but pt wanting to wait and see    Status  Achieved            Plan - 07/06/19 1646    Clinical Impression Statement  Much improved today since last visit with this PT.  Cording more proximal with hardness evident in the axilla most likely fat tissue from ALND.  Overall PROM and AROM are close to the opposite UE with pull the only deficit with overhead reach.  Added some 2# weight supine exercises and advanced stretches to include doorway stretch.    PT Frequency  2x / week    PT Duration  4 weeks    PT Treatment/Interventions  ADLs/Self Care Home Management;Therapeutic exercise;Patient/family education;Manual techniques;Manual lymph drainage;Passive range of motion    PT Next Visit Plan  try supine scap, continue left shoulder PROM and cording work    PT Home Exercise Plan  Access Code: [email or web address]    Consulted and Agree with Plan of Care  Patient       Patient will benefit from skilled therapeutic intervention in order to improve the following deficits and impairments:     Visit Diagnosis: Malignant neoplasm of lower-inner quadrant of left breast in female, estrogen receptor positive ([place])  Abnormal posture  Aftercare following surgery for neoplasm  Stiffness of left shoulder, not elsewhere classified     Problem List Patient Active Problem List   Diagnosis Date Noted  . Breast cancer, left breast ([place]) 05/26/2019  . Genetic testing 05/22/2019  . Family history of breast cancer   . Family history of colon cancer   . Family history of prostate cancer   . Family history of kidney cancer   . Malignant neoplasm of upper-inner quadrant of left breast in female, estrogen receptor positive ([place]) 05/06/2019  .  Family history of breast cancer in mother 04/02/2019  . Weight loss 04/02/2019  .  Abnormal TSH 04/02/2019  . Diverticulitis 04/02/2019  . Aortic atherosclerosis ([place]) 04/02/2019  . Smoker 01/30/2016  . Encounter for smoking cessation counseling 01/30/2016  . HYPERLIPIDEMIA 12/15/2007  . CERVICAL POLYP 12/15/2007  . RECTAL BLEEDING, HX OF 12/15/2007    Stark Bray 07/06/2019, 4:52 PM  [place], [place], [address] Phone: [phone]   Fax:  [phone]  Name: Victoria Richardson MRN: [id number] Date of Birth: 1961/09/12  PHYSICAL THERAPY DISCHARGE SUMMARY  Visits from Start of Care: 5  Current functional level related to goals / functional outcomes: See above   Remaining deficits: Pt doing well overall   Education / Equipment: HEP and self care Plan: Patient agrees to discharge.  Patient goals were partially met. Patient is being discharged due to not returning since the last visit.  ?????     Shan Levans, PT

## 2019-07-22 DIAGNOSIS — C50912 Malignant neoplasm of unspecified site of left female breast: Secondary | ICD-10-CM | POA: Diagnosis not present

## 2019-09-30 ENCOUNTER — Telehealth: Payer: Self-pay

## 2019-09-30 NOTE — Telephone Encounter (Signed)
Spoke with patient about SCP visit and she prefers to come in for appt.

## 2019-10-01 ENCOUNTER — Encounter: Payer: Self-pay | Admitting: Adult Health

## 2019-10-01 ENCOUNTER — Other Ambulatory Visit: Payer: Self-pay

## 2019-10-01 ENCOUNTER — Inpatient Hospital Stay: Payer: BC Managed Care – PPO | Attending: Adult Health | Admitting: Adult Health

## 2019-10-01 ENCOUNTER — Telehealth: Payer: Self-pay | Admitting: Hematology and Oncology

## 2019-10-01 VITALS — BP 154/84 | HR 63 | Temp 97.2°F | Resp 17 | Ht 64.0 in

## 2019-10-01 DIAGNOSIS — C50212 Malignant neoplasm of upper-inner quadrant of left female breast: Secondary | ICD-10-CM | POA: Diagnosis not present

## 2019-10-01 DIAGNOSIS — Z79811 Long term (current) use of aromatase inhibitors: Secondary | ICD-10-CM | POA: Insufficient documentation

## 2019-10-01 DIAGNOSIS — F17200 Nicotine dependence, unspecified, uncomplicated: Secondary | ICD-10-CM | POA: Diagnosis not present

## 2019-10-01 DIAGNOSIS — E2839 Other primary ovarian failure: Secondary | ICD-10-CM

## 2019-10-01 DIAGNOSIS — Z1231 Encounter for screening mammogram for malignant neoplasm of breast: Secondary | ICD-10-CM | POA: Diagnosis not present

## 2019-10-01 DIAGNOSIS — Z170 Estrogen receptor positive status [ER+]: Secondary | ICD-10-CM

## 2019-10-01 NOTE — Patient Instructions (Signed)

## 2019-10-01 NOTE — Telephone Encounter (Signed)
I talk with patient regarding schedule  

## 2019-10-01 NOTE — Progress Notes (Signed)
SURVIVORSHIP VISIT:   BRIEF ONCOLOGIC HISTORY:  Oncology History  Malignant neoplasm of upper-inner quadrant of left breast in female, estrogen receptor positive ([place])  05/06/2019 Initial Diagnosis   Routine screening mammogram detected an irregular left breast mass at the 9 o'clock position, 2.5cm, with a 0.5cm ill-defined adjacent mass and 1.0cm line of suspicious calcifications inferior and medial to the primary mass. No evidence of left axillary adenopathy. Biopsy showed IDC, grade 2-3, intermediate grade DCIS, HER-2 - by FISH, ER+ 100%, PR -, Ki67 20%.   05/21/2019 Genetic Testing   Negative genetic testing on the [facility] Common Hereditary Cancers Panel + Renal Cancer Panel. The Common Hereditary Cancers Panel offered by [facility] includes sequencing and/or deletion duplication testing of the following 48 genes: APC, ATM, AXIN2, BARD1, BMPR1A, BRCA1, BRCA2, BRIP1, CDH1, CDKN2A (p14ARF), CDKN2A (p16INK4a), CKD4, CHEK2, CTNNA1, DICER1, EPCAM (Deletion/duplication testing only), GREM1 (promoter region deletion/duplication testing only), KIT, MEN1, MLH1, MSH2, MSH3, MSH6, MUTYH, NBN, NF1, NHTL1, PALB2, PDGFRA, PMS2, POLD1, POLE, PTEN, RAD50, RAD51C, RAD51D, RNF43, SDHB, SDHC, SDHD, SMAD4, SMARCA4. STK11, TP53, TSC1, TSC2, and VHL.  The following genes were evaluated for sequence changes only: SDHA and HOXB13 c.251G>A variant only. The [facility] Renal/Urinary Tract Cancers Panel analyzes the following 24 genes:BAP1 ,CDC73, CDKN1C, DICER1, DIS3L2, EPCAM, FH, FLCN, GPC3, MET, MLH1, MSH2, MSH6, PMS2, PTEN, SDHB, SDHC, SMARCA4, SMARCB1, TP53, TSC1, TSC2, VHL, WT1. The report date is 05/21/2019.   05/26/2019 Surgery   Left mastectomy [name]) [phone]): two malignant tumors, First tumor: IDC grade 2, 2.2cm, and IDC grade 3, 0.5cm, clear margins, 8 lymph nodes negative for carcinoma, ER 100%, PR 0%, Ki-67 5%; second tumor: Grade 3 IDC 0.5 cm, margins negative, 0/2 lymph nodes, ER 100%, PR 5%, Ki-67 30%, HER-2  -1+   05/26/2019 Oncotype testing   The Oncotype DX score was 20 predicting a risk of outside the breast recurrence over the next 9 years of 6 % if the patient's only systemic therapy is tamoxifen for 5 years.    06/02/2019 Cancer Staging   Staging form: Breast, AJCC 8th Edition - Pathologic stage from 06/02/2019: Stage IIA (pT2, pN0, cM0, G2, ER+, PR-, HER2-) - Signed by Nicholas Lose, MD on 06/02/2019   06/2019 - 06/2024 Anti-estrogen oral therapy   Anastrozole 32m/daily      INTERVAL HISTORY:  Ms. BFronekto review her survivorship care plan detailing her treatment course for breast cancer, as well as monitoring long-term side effects of that treatment, education regarding health maintenance, screening, and overall wellness and health promotion.     Overall, Ms. BCapecereports feeling quite well.  She is taking Anastrozole daily and is tolerating this moderately well.  She has some mild arthralgias, more so in her thigh and right hip.  It is intermittent, and only in that one area.    REVIEW OF SYSTEMS:  Review of Systems  Constitutional: Negative for appetite change, chills, fatigue, fever and unexpected weight change.  HENT:   Negative for hearing loss, lump/mass, nosebleeds, sore throat, tinnitus and voice change.   Eyes: Negative for eye problems and icterus.  Respiratory: Negative for chest tightness, cough and shortness of breath.   Cardiovascular: Negative for chest pain, leg swelling and palpitations.  Gastrointestinal: Negative for abdominal distention, abdominal pain, constipation, diarrhea, nausea and vomiting.  Endocrine: Negative for hot flashes.  Genitourinary: Negative for difficulty urinating.   Musculoskeletal: Negative for arthralgias.  Skin: Negative for itching and rash.  Neurological: Negative for dizziness, extremity weakness, headaches and numbness.  Hematological:  Negative for adenopathy. Does not bruise/bleed easily.  Psychiatric/Behavioral: Negative for  depression. The patient is not nervous/anxious.    Breast: Denies any new nodularity, masses, tenderness, nipple changes, or nipple discharge.      ONCOLOGY TREATMENT TEAM:  1. Surgeon:  Dr. [name] at [facility] 2. Medical Oncologist: Dr. Lindi Adie   3. Radiation Oncologist: Dr. Lisbeth Renshaw    PAST MEDICAL/SURGICAL HISTORY:  Past Medical History:  Diagnosis Date  . Allergy   . Breast cancer in female [facility])    Left  . Diverticulitis   . Family history of adverse reaction to anesthesia    Mom has post-op n/v  . Family history of breast cancer   . Family history of colon cancer   . Family history of kidney cancer   . Family history of prostate cancer    Past Surgical History:  Procedure Laterality Date  . ADENOIDECTOMY    . COLONOSCOPY    . DILATION AND CURETTAGE OF UTERUS    . MASTECTOMY W/ SENTINEL NODE BIOPSY Left 05/26/2019   Procedure: LEFT MASTECTOMY WITH LEFT AXILLARY SENTINEL LYMPH NODE BIOPSY;  Surgeon: Rolm Bookbinder, MD;  Location: [place];  Service: General;  Laterality: Left;  . tubaligation       ALLERGIES:  No Known Allergies   CURRENT MEDICATIONS:  Outpatient Encounter Medications as of 10/01/2019  Medication Sig  . anastrozole (ARIMIDEX) 1 MG tablet Take 1 tablet (1 mg total) by mouth daily.  . Ascorbic Acid (VITAMIN C PO) Take 1 tablet by mouth daily.   . Multiple Vitamin (MULTIVITAMIN) capsule Take 1 capsule by mouth daily.  . [DISCONTINUED] traMADol (ULTRAM) 50 MG tablet Take 1 tablet (50 mg total) by mouth every 6 (six) hours as needed for moderate pain. (Patient not taking: Reported on 10/01/2019)   No facility-administered encounter medications on file as of 10/01/2019.     ONCOLOGIC FAMILY HISTORY:  Family History  Problem Relation Age of Onset  . Cancer Mother 35       Breast Cancer  . Colon polyps Mother   . Heart disease Father 56       CAD  . Colon cancer Maternal Grandmother        dx 53s  . Kidney cancer Maternal Aunt    . Prostate cancer Maternal Uncle   . Cancer Maternal Uncle        unk type  . Breast cancer Cousin        dx early 57s  . Hodgkin's lymphoma Cousin   . Esophageal cancer Neg Hx   . Rectal cancer Neg Hx   . Stomach cancer Neg Hx   . Pancreatic cancer Neg Hx      GENETIC COUNSELING/TESTING: See above  SOCIAL HISTORY:  Social History   Socioeconomic History  . Marital status: Married    Spouse name: Not on file  . Number of children: Not on file  . Years of education: Not on file  . Highest education level: Not on file  Occupational History  . Not on file  Tobacco Use  . Smoking status: Current Every Day Smoker    Packs/day: 1.00    Years: 45.00    Pack years: 45.00    Types: Cigarettes  . Smokeless tobacco: Never Used  Substance and Sexual Activity  . Alcohol use: Yes    Alcohol/week: 0.0 standard drinks    Comment: rarely  . Drug use: No  . Sexual activity: Not Currently  Other Topics Concern  .  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   Married.    Smokes---   Social Determinants of Health   Financial Resource Strain:   . Difficulty of Paying Living Expenses: Not on file  Food Insecurity:   . Worried About Charity fundraiser in the Last Year: Not on file  . Ran Out of Food in the Last Year: Not on file  Transportation Needs:   . Lack of Transportation (Medical): Not on file  . Lack of Transportation (Non-Medical): Not on file  Physical Activity:   . Days of Exercise per Week: Not on file  . Minutes of Exercise per Session: Not on file  Stress:   . Feeling of Stress : Not on file  Social Connections:   . Frequency of Communication with Friends and Family: Not on file  . Frequency of Social Gatherings with Friends and Family: Not on file  . Attends Religious Services: Not on file  . Active Member of Clubs or Organizations: Not on file  . Attends Archivist Meetings:  Not on file  . Marital Status: Not on file  Intimate Partner Violence:   . Fear of Current or Ex-Partner: Not on file  . Emotionally Abused: Not on file  . Physically Abused: Not on file  . Sexually Abused: Not on file     OBSERVATIONS/OBJECTIVE:  BP (!) 154/84 (BP Location: Right Arm, Patient Position: Sitting)   Pulse 63   Temp (!) 97.2 F (36.2 C) (Temporal)   Resp 17   Ht '5\' 4"'  (1.626 m)   SpO2 100%   BMI 18.68 kg/m  ECOG 1 GENERAL: Patient is a well appearing female in no acute distress HEENT:  Sclerae anicteric.  Oropharynx clear and moist. No ulcerations or evidence of oropharyngeal candidiasis. Neck is supple.  NODES:  No cervical, supraclavicular, or axillary lymphadenopathy palpated.  BREAST EXAM:  Right breast benign, left breast s/p mastectomy, no sign of local recurrence. LUNGS:  Clear to auscultation bilaterally.  No wheezes or rhonchi. HEART:  Regular rate and rhythm. No murmur appreciated. ABDOMEN:  Soft, nontender.  Positive, normoactive bowel sounds. No organomegaly palpated. MSK:  No focal spinal tenderness to palpation. Full range of motion bilaterally in the upper extremities. EXTREMITIES:  No peripheral edema.   SKIN:  Clear with no obvious rashes or skin changes. No nail dyscrasia. NEURO:  Nonfocal. Well oriented.  Appropriate affect.    LABORATORY DATA:  None for this visit.  DIAGNOSTIC IMAGING:  None for this visit.      ASSESSMENT AND PLAN:  Ms.. Curran is a pleasant 59 y.o. female with Stage IIA left breast invasive ductal carcinoma, ER+/PR-/HER2-, diagnosed in 04/2019, left mastectomy, and anti-estrogen therapy with Anastrozole beginning in 06/2019.  She presents to the Survivorship Clinic for our initial meeting and routine follow-up post-completion of treatment for breast cancer.    1. Stage IIA left breast cancer:  Ms. Verastegui is continuing to recover from definitive treatment for breast cancer. She will follow-up with her medical  oncologist, Dr. Lindi Adie in 3 months with history and physical exam per surveillance protocol.  She will continue her anti-estrogen therapy with Anastrozole. Thus far, she is tolerating the Anastrozole well, with minimal side effects. She was instructed to make Dr. Lindi Adie or myself aware if she begins to experience any worsening side effects of the medication and I could see her back in clinic to help manage those  side effects, as needed. Her mammogram is due 04/2020; orders placed today. Today, a comprehensive survivorship care plan and treatment summary was reviewed with the patient today detailing her breast cancer diagnosis, treatment course, potential late/long-term effects of treatment, appropriate follow-up care with recommendations for the future, and patient education resources.  A copy of this summary, along with a letter will be sent to the patient's primary care provider via mail/fax/In Basket message after today's visit.    2. Right hip pain: Exam reveals likely greater trochanteric bursitis.  Recommend anti inflammatories such as aleve bid prn, and f/u with PCP for possible injection.  She declined an xray today.  3. Bone health:  Given Ms. Vlcek's age/history of breast cancer and her current treatment regimen including anti-estrogen therapy with Anastrozole, she is at risk for bone demineralization.  She has not undergone bone density testing and I placed orders for this to be completed.  She was given education on specific activities to promote bone health.  4. Cancer screening:  Due to Ms. Gardiner's history and her age, she should receive screening for skin cancers, colon cancer, lung cancer, and gynecologic cancers.  The information and recommendations are listed on the patient's comprehensive care plan/treatment summary and were reviewed in detail with the patient.    5. Health maintenance and wellness promotion: Ms. Hobbins was encouraged to consume 5-7 servings of fruits and vegetables per  day. We reviewed the "Nutrition Rainbow" handout, as well as the handout "Take Control of Your Health and Reduce Your Cancer Risk" from the [place].  She was also encouraged to engage in moderate to vigorous exercise for 30 minutes per day most days of the week. We discussed the [employer] fitness program, which is designed for cancer survivors to help them become more physically fit after cancer treatments.  She was instructed to limit her alcohol consumption and continue to abstain from tobacco use.     6. Support services/counseling: It is not uncommon for this period of the patient's cancer care trajectory to be one of many emotions and stressors.  We discussed how this can be increasingly difficult during the times of quarantine and social distancing due to the COVID-19 pandemic.   She was given information regarding our available services and encouraged to contact me with any questions or for help enrolling in any of our support group/programs.    Follow up instructions:    -Return to cancer center 3 months with Dr. Lindi Adie -Mammogram due in 04/2020 -Follow up with surgery 9 months -Lung cancer screening due -Bone density in 04/2020 -She is welcome to return back to the Survivorship Clinic at any time; no additional follow-up needed at this time.  -Consider referral back to survivorship as a long-term survivor for continued surveillance  She was recommended to continue with the appropriate pandemic precautions.    A total of (30) minutes of face-to-face time was spent with this patient with greater than 50% of that time in counseling and care-coordination.   Scot Dock, NP

## 2019-10-12 ENCOUNTER — Other Ambulatory Visit: Payer: Self-pay

## 2019-10-12 ENCOUNTER — Ambulatory Visit (HOSPITAL_COMMUNITY)
Admission: RE | Admit: 2019-10-12 | Discharge: 2019-10-12 | Disposition: A | Discharge status: Home / Self Care | Payer: BC Managed Care – PPO | Source: Ambulatory Visit | Attending: Adult Health | Admitting: Adult Health

## 2019-10-12 DIAGNOSIS — F17200 Nicotine dependence, unspecified, uncomplicated: Secondary | ICD-10-CM | POA: Diagnosis not present

## 2019-10-12 DIAGNOSIS — C50212 Malignant neoplasm of upper-inner quadrant of left female breast: Secondary | ICD-10-CM | POA: Diagnosis not present

## 2019-10-12 DIAGNOSIS — Z1231 Encounter for screening mammogram for malignant neoplasm of breast: Secondary | ICD-10-CM | POA: Insufficient documentation

## 2019-10-12 DIAGNOSIS — F17210 Nicotine dependence, cigarettes, uncomplicated: Secondary | ICD-10-CM | POA: Diagnosis not present

## 2019-10-12 DIAGNOSIS — Z170 Estrogen receptor positive status [ER+]: Secondary | ICD-10-CM | POA: Insufficient documentation

## 2019-10-13 ENCOUNTER — Other Ambulatory Visit: Payer: Self-pay | Admitting: Adult Health

## 2019-10-13 DIAGNOSIS — F17200 Nicotine dependence, unspecified, uncomplicated: Secondary | ICD-10-CM

## 2019-10-13 NOTE — Progress Notes (Signed)
Called patient and reviewed with her that she has pulmonary nodules noted on CT scan.  Reviewed the recommendation for f/u in 6 months.  Orders placed.  Ellason verbalized understanding.    Wilber Bihari, NP

## 2019-11-09 ENCOUNTER — Ambulatory Visit: Payer: BC Managed Care – PPO

## 2019-11-10 ENCOUNTER — Ambulatory Visit: Payer: BC Managed Care – PPO

## 2019-11-11 ENCOUNTER — Ambulatory Visit: Payer: BC Managed Care – PPO

## 2019-11-12 ENCOUNTER — Ambulatory Visit: Payer: BC Managed Care – PPO

## 2019-11-13 ENCOUNTER — Ambulatory Visit: Payer: BC Managed Care – PPO

## 2019-11-16 ENCOUNTER — Ambulatory Visit: Payer: BC Managed Care – PPO

## 2019-11-17 ENCOUNTER — Ambulatory Visit: Payer: BC Managed Care – PPO

## 2019-11-18 ENCOUNTER — Ambulatory Visit: Payer: BC Managed Care – PPO

## 2019-11-19 ENCOUNTER — Ambulatory Visit: Payer: BC Managed Care – PPO

## 2019-11-20 ENCOUNTER — Ambulatory Visit: Payer: BC Managed Care – PPO

## 2019-11-23 ENCOUNTER — Ambulatory Visit: Payer: BC Managed Care – PPO

## 2019-11-24 ENCOUNTER — Ambulatory Visit: Payer: BC Managed Care – PPO

## 2019-11-25 ENCOUNTER — Ambulatory Visit: Payer: BC Managed Care – PPO

## 2019-11-26 ENCOUNTER — Ambulatory Visit: Payer: BC Managed Care – PPO

## 2019-11-27 ENCOUNTER — Ambulatory Visit: Payer: BC Managed Care – PPO

## 2019-11-30 ENCOUNTER — Ambulatory Visit: Payer: BC Managed Care – PPO

## 2019-12-01 ENCOUNTER — Ambulatory Visit: Payer: BC Managed Care – PPO

## 2019-12-02 ENCOUNTER — Ambulatory Visit: Payer: BC Managed Care – PPO

## 2019-12-03 ENCOUNTER — Ambulatory Visit: Payer: BC Managed Care – PPO

## 2019-12-04 ENCOUNTER — Ambulatory Visit: Payer: BC Managed Care – PPO

## 2019-12-28 ENCOUNTER — Other Ambulatory Visit: Payer: Self-pay | Admitting: Hematology and Oncology

## 2019-12-29 NOTE — Progress Notes (Signed)
Patient Care Team: Victoria Frizzle, MD as PCP - General (Family Medicine) Victoria Bookbinder, MD as Consulting Physician (General Surgery) Victoria Lose, MD as Consulting Physician (Hematology and Oncology) Victoria Rudd, MD as Consulting Physician (Radiation Oncology)  DIAGNOSIS:    ICD-10-CM   1. Malignant neoplasm of upper-inner quadrant of left breast in female, estrogen receptor positive ([place])  C50.212    Z17.0     SUMMARY OF ONCOLOGIC HISTORY: Oncology History  Malignant neoplasm of upper-inner quadrant of left breast in female, estrogen receptor positive ([place])  05/06/2019 Initial Diagnosis   Routine screening mammogram detected an irregular left breast mass at the 9 o'clock position, 2.5cm, with a 0.5cm ill-defined adjacent mass and 1.0cm line of suspicious calcifications inferior and medial to the primary mass. No evidence of left axillary adenopathy. Biopsy showed IDC, grade 2-3, intermediate grade DCIS, HER-2 - by FISH, ER+ 100%, PR -, Ki67 20%.   05/21/2019 Genetic Testing   Negative genetic testing on the [facility] Common Hereditary Cancers Panel + Renal Cancer Panel. The Common Hereditary Cancers Panel offered by [facility] includes sequencing and/or deletion duplication testing of the following 48 genes: APC, ATM, AXIN2, BARD1, BMPR1A, BRCA1, BRCA2, BRIP1, CDH1, CDKN2A (p14ARF), CDKN2A (p16INK4a), CKD4, CHEK2, CTNNA1, DICER1, EPCAM (Deletion/duplication testing only), GREM1 (promoter region deletion/duplication testing only), KIT, MEN1, MLH1, MSH2, MSH3, MSH6, MUTYH, NBN, NF1, NHTL1, PALB2, PDGFRA, PMS2, POLD1, POLE, PTEN, RAD50, RAD51C, RAD51D, RNF43, SDHB, SDHC, SDHD, SMAD4, SMARCA4. STK11, TP53, TSC1, TSC2, and VHL.  The following genes were evaluated for sequence changes only: SDHA and HOXB13 c.251G>A variant only. The [facility] Renal/Urinary Tract Cancers Panel analyzes the following 24 genes:BAP1 ,CDC73, CDKN1C, DICER1, DIS3L2, EPCAM, FH, FLCN, GPC3, MET, MLH1, MSH2, MSH6, PMS2,  PTEN, SDHB, SDHC, SMARCA4, SMARCB1, TP53, TSC1, TSC2, VHL, WT1. The report date is 05/21/2019.   05/26/2019 Surgery   Left mastectomy Victoria Richardson) [phone]): two malignant tumors, First tumor: IDC grade 2, 2.2cm, and IDC grade 3, 0.5cm, clear margins, 8 lymph nodes negative for carcinoma, ER 100%, PR 0%, Ki-67 5%; second tumor: Grade 3 IDC 0.5 cm, margins negative, 0/2 lymph nodes, ER 100%, PR 5%, Ki-67 30%, HER-2 -1+   05/26/2019 Oncotype testing   The Oncotype DX score was 20 predicting a risk of outside the breast recurrence over the next 9 years of 6 % if the patient's only systemic therapy is tamoxifen for 5 years.    06/02/2019 Cancer Staging   Staging form: Breast, AJCC 8th Edition - Pathologic stage from 06/02/2019: Stage IIA (pT2, pN0, cM0, G2, ER+, PR-, HER2-) - Signed by Victoria Lose, MD on 06/02/2019   06/2019 - 06/2024 Anti-estrogen oral therapy   Anastrozole 58m/daily      CHIEF COMPLIANT: Follow-up of left breast cancer on anastrozole  INTERVAL HISTORY: Victoria Gotois a 59y.o. with above-mentioned history of left breast cancer treated with mastectomy and who is currently on antiestrogen therapy with anastrozole. She presents to the clinic today for follow-up.   ALLERGIES:  has No Known Allergies.  MEDICATIONS:  Current Outpatient Medications  Medication Sig Dispense Refill  . anastrozole (ARIMIDEX) 1 MG tablet TAKE 1 TABLET(1 MG) BY MOUTH DAILY 90 tablet 0  . Ascorbic Acid (VITAMIN C PO) Take 1 tablet by mouth daily.     . Multiple Vitamin (MULTIVITAMIN) capsule Take 1 capsule by mouth daily.     No current facility-administered medications for this visit.    PHYSICAL EXAMINATION: ECOG PERFORMANCE STATUS: 1 - Symptomatic but completely ambulatory  Vitals:  12/30/19 1037  BP: (!) 165/84  Pulse: 66  Resp: 18  Temp: 98.9 F (37.2 C)  SpO2: 100%   Filed Weights   12/30/19 1037  Weight: 105 lb 9.6 oz (47.9 kg)    BREAST: Victoria Richardson can do them if  that were MIR can also be she is not here so we have to do 20 no palpable masses or nodules in either right or left breasts. No palpable axillary supraclavicular or infraclavicular adenopathy no breast tenderness or nipple discharge. (exam performed in the presence of a chaperone)  LABORATORY DATA:  I have reviewed the data as listed CMP Latest Ref Rng & Units 05/21/2019 05/13/2019 04/02/2019  Glucose 70 - 99 mg/dL 122(H) 100(H) 95  BUN 6 - 20 mg/dL _0 Creatinine 0.44 - 1.00 mg/dL 0.55 0.66 0.54  Sodium 135 - 145 mmol/L 139 144 143  Potassium 3.5 - 5.1 mmol/L 3.0(L) 3.0(LL) 3.9  Chloride 98 - 111 mmol/L 101 103 105  CO2 22 - 32 mmol/L _1 Calcium 8.9 - 10.3 mg/dL 9.1 9.3 9.8  Total Protein 6.5 - 8.1 g/dL - 7.2 7.0  Total Bilirubin 0.3 - 1.2 mg/dL - 0.9 0.8  Alkaline Phos 38 - 126 U/L - 66 -  AST 15 - 41 U/L - 17 10  ALT 0 - 44 U/L - 17 11    Lab Results  Component Value Date   WBC 7.9 05/21/2019   HGB 14.8 05/21/2019   HCT 42.3 05/21/2019   MCV 96.1 05/21/2019   PLT 294 05/21/2019   NEUTROABS 5.1 05/13/2019    ASSESSMENT & PLAN:  Malignant neoplasm of upper-inner quadrant of left breast in female, estrogen receptor positive ([place]) 05/26/2019: Left mastectomy Victoria Richardson): two malignant tumors, First tumor: IDC grade 2, 2.2cm, and IDC grade 3, 0.5cm, clear margins, 8 lymph nodes negative for carcinoma, ER 100%, PR 0%, Ki-67 5%; second tumor: Grade 3 IDC 0.5 cm, margins negative, 0/2 lymph nodes, ER 100%, PR 5%, Ki-67 30%, HER-2 -1+  Treatment plan: 1. Oncotype DX score: 20: ROR: 6% 2. Adjuvant antiestrogen therapy with anastrozole 1 mg daily x5 to 7 years started  Her momRena Purcellis also a patient of mine and breast cancer survivor.  (She is in the hospital with upper GI bleed) Genetic testing: Negative  Anastrozole Toxicities: Occasional hot flashes but denies any arthralgias or myalgias. Her fingers feel tight in the morning but they get better through the  day.  Breast Cancer Surveillance: 1. Mammograms will need to be scheduled for September. 2. Breast Exam: 12/30/2019: Benign  Return to clinic in 1 year for follow-up   No orders of the defined types were placed in this encounter.  The patient has a good understanding of the overall plan. she agrees with it. she will call with any problems that may develop before the next visit here.  Total time spent: 20 mins including face to face time and time spent for planning, charting and coordination of care  Victoria Lose, MD 12/30/2019  I, Cloyde Reams Dorshimer, am acting as scribe for Dr. Nicholas Richardson.  I have reviewed the above documentation for accuracy and completeness, and I agree with the above.

## 2019-12-30 ENCOUNTER — Inpatient Hospital Stay: Payer: BC Managed Care – PPO | Attending: Adult Health | Admitting: Hematology and Oncology

## 2019-12-30 ENCOUNTER — Other Ambulatory Visit: Payer: Self-pay

## 2019-12-30 DIAGNOSIS — Z170 Estrogen receptor positive status [ER+]: Secondary | ICD-10-CM | POA: Diagnosis not present

## 2019-12-30 DIAGNOSIS — Z79811 Long term (current) use of aromatase inhibitors: Secondary | ICD-10-CM | POA: Insufficient documentation

## 2019-12-30 DIAGNOSIS — C50212 Malignant neoplasm of upper-inner quadrant of left female breast: Secondary | ICD-10-CM | POA: Insufficient documentation

## 2019-12-30 NOTE — Assessment & Plan Note (Signed)
05/26/2019: Left mastectomy Victoria Richardson): two malignant tumors, First tumor: IDC grade 2, 2.2cm, and IDC grade 3, 0.5cm, clear margins, 8 lymph nodes negative for carcinoma, ER 100%, PR 0%, Ki-67 5%; second tumor: Grade 3 IDC 0.5 cm, margins negative, 0/2 lymph nodes, ER 100%, PR 5%, Ki-67 30%, HER-2 -1+  Treatment plan: 1. Oncotype DX score: 20: ROR: 6% 2. Adjuvant antiestrogen therapy with anastrozole 1 mg daily x5 to 7 years started  Her momRena Purcellis also a patient of mine and breast cancer survivor. Genetic testing: Negative  Anastrozole Toxicities:  Breast Cancer Surveillance: 1. Mammograms will need to be scheduled for September. 2. Breast Exam: 12/30/2019: Benign  Return to clinic in 1 year for follow-up

## 2020-01-05 ENCOUNTER — Telehealth: Payer: Self-pay | Admitting: Hematology and Oncology

## 2020-01-05 NOTE — Telephone Encounter (Signed)
Scheduled per 04/14 los, patient has been called and notified. ?

## 2020-03-24 ENCOUNTER — Other Ambulatory Visit: Payer: Self-pay | Admitting: Hematology and Oncology

## 2020-04-04 ENCOUNTER — Other Ambulatory Visit: Payer: Self-pay

## 2020-04-04 ENCOUNTER — Ambulatory Visit (INDEPENDENT_AMBULATORY_CARE_PROVIDER_SITE_OTHER): Payer: BC Managed Care – PPO | Admitting: Family Medicine

## 2020-04-04 VITALS — BP 140/90 | HR 85 | Temp 97.9°F | Wt 108.0 lb

## 2020-04-04 DIAGNOSIS — IMO0001 Reserved for inherently not codable concepts without codable children: Secondary | ICD-10-CM

## 2020-04-04 DIAGNOSIS — Z0000 Encounter for general adult medical examination without abnormal findings: Secondary | ICD-10-CM | POA: Diagnosis not present

## 2020-04-04 DIAGNOSIS — Z780 Asymptomatic menopausal state: Secondary | ICD-10-CM

## 2020-04-04 DIAGNOSIS — R946 Abnormal results of thyroid function studies: Secondary | ICD-10-CM | POA: Diagnosis not present

## 2020-04-04 DIAGNOSIS — C50012 Malignant neoplasm of nipple and areola, left female breast: Secondary | ICD-10-CM

## 2020-04-04 DIAGNOSIS — I10 Essential (primary) hypertension: Secondary | ICD-10-CM

## 2020-04-04 DIAGNOSIS — Z0001 Encounter for general adult medical examination with abnormal findings: Secondary | ICD-10-CM | POA: Diagnosis not present

## 2020-04-04 DIAGNOSIS — F17200 Nicotine dependence, unspecified, uncomplicated: Secondary | ICD-10-CM

## 2020-04-04 DIAGNOSIS — E049 Nontoxic goiter, unspecified: Secondary | ICD-10-CM | POA: Diagnosis not present

## 2020-04-04 MED ORDER — LOSARTAN POTASSIUM 50 MG PO TABS: 50.0000 mg | ORAL_TABLET | Freq: Every day | ORAL | 5 refills | Status: DC

## 2020-04-04 NOTE — Progress Notes (Signed)
Subjective:    Patient ID: Victoria Richardson, female    DOB: 06-01-1961, 59 y.o.   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  She underwent a simple mastectomy.  Thankfully lymph nodes were negative for any malignant spread.  Colonoscopy was last performed in 2019.  This showed diverticulosis but was otherwise normal and she is not due again for 10 years.  Her last Pap smear was performed in 2020 and was normal.  She is not due for repeat Pap smear until 2023.  She is due for mammogram in September along with a bone density test.  I went ahead and schedule this today.  Her blood pressure here is borderline.  Her last 2 visit at the cancer specialist her blood pressure has been elevated with a systolic blood pressure in the 150-160 range.  Unfortunately she continues to smoke approximately 1 pack of cigarettes per day.  She had a CT scan of the lungs which revealed multiple small pulmonary nodules but no obvious cancer.  It did show coronary artery calcifications.  Past Medical History:  Diagnosis Date   Allergy    Breast cancer in female [facility])    Left   Diverticulitis    Family history of adverse reaction to anesthesia    Mom has post-op n/v   Family history of breast cancer    Family history of colon cancer    Family history of kidney cancer    Family history of prostate cancer    Past Surgical History:  Procedure Laterality Date   ADENOIDECTOMY     BREAST SURGERY N/A    Phreesia 04/01/2020   COLONOSCOPY     DILATION AND CURETTAGE OF UTERUS     MASTECTOMY W/ SENTINEL NODE BIOPSY Left 05/26/2019   Procedure: LEFT MASTECTOMY WITH LEFT AXILLARY SENTINEL LYMPH NODE BIOPSY;  Surgeon: Rolm Bookbinder, MD;  Location: [place];  Service: General;  Laterality: Left;   tubaligation     Current Outpatient Medications on File Prior to Visit  Medication Sig Dispense Refill   anastrozole (ARIMIDEX) 1 MG  tablet TAKE 1 TABLET(1 MG) BY MOUTH DAILY 90 tablet 0   Ascorbic Acid (VITAMIN C PO) Take 1 tablet by mouth daily.      Multiple Vitamin (MULTIVITAMIN) capsule Take 1 capsule by mouth daily.     No current facility-administered medications on file prior to visit.   No Known Allergies Social History   Socioeconomic History   Marital status: Married    Spouse name: Not on file   Number of children: Not on file   Years of education: Not on file   Highest education level: Not on file  Occupational History   Not on file  Tobacco Use   Smoking status: Current Every Day Smoker    Packs/day: 1.00    Years: 45.00    Pack years: 45.00    Types: Cigarettes   Smokeless tobacco: Never Used  Vaping Use   Vaping Use: Never used  Substance and Sexual Activity   Alcohol use: Yes    Alcohol/week: 0.0 standard drinks    Comment: rarely   Drug use: No   Sexual activity: Not Currently  Other Topics Concern   Not on file  Social History Narrative   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   Married.    Smokes---   Social Determinants of Health   Financial Resource Strain:  Difficulty of Paying Living Expenses:   Food Insecurity:    Worried About Charity fundraiser in the Last Year:    Arboriculturist in the Last Year:   Transportation Needs:    Film/video editor (Medical):    Lack of Transportation (Non-Medical):   Physical Activity:    Days of Exercise per Week:    Minutes of Exercise per Session:   Stress:    Feeling of Stress :   Social Connections:    Frequency of Communication with Friends and Family:    Frequency of Social Gatherings with Friends and Family:    Attends Religious Services:    Active Member of Clubs or Organizations:    Attends Music therapist:    Marital Status:   Intimate Partner Violence:    Fear of Current or Ex-Partner:    Emotionally Abused:    Physically Abused:     Sexually Abused:    Family History  Problem Relation Age of Onset   Cancer Mother 33       Breast Cancer   Colon polyps Mother    Heart disease Father 43       CAD   Colon cancer Maternal Grandmother        dx 59s   Kidney cancer Maternal Aunt    Prostate cancer Maternal Uncle    Cancer Maternal Uncle        unk type   Breast cancer Cousin        dx early 51s   Hodgkin's lymphoma Cousin    Esophageal cancer Neg Hx    Rectal cancer Neg Hx    Stomach cancer Neg Hx    Pancreatic cancer Neg Hx      Review of Systems  All other systems reviewed and are negative.      Objective:   Physical Exam Vitals reviewed.  Constitutional:      General: She is not in acute distress.    Appearance: Normal appearance. She is not ill-appearing, toxic-appearing or diaphoretic.  HENT:     Head: Normocephalic and atraumatic.     Right Ear: Tympanic membrane, ear canal and external ear normal. There is no impacted cerumen.     Left Ear: Tympanic membrane, ear canal and external ear normal. There is no impacted cerumen.     Nose: Nose normal. No congestion or rhinorrhea.     Mouth/Throat:     Mouth: Mucous membranes are moist.     Pharynx: Oropharynx is clear.  Eyes:     General: No scleral icterus.       Right eye: No discharge.        Left eye: No discharge.     Extraocular Movements: Extraocular movements intact.     Conjunctiva/sclera: Conjunctivae normal.     Pupils: Pupils are equal, round, and reactive to light.  Neck:     Thyroid: Thyromegaly present.     Vascular: No carotid bruit.  Cardiovascular:     Rate and Rhythm: Normal rate and regular rhythm.     Pulses: Normal pulses.     Heart sounds: Normal heart sounds. No murmur heard.  No friction rub. No gallop.   Pulmonary:     Effort: Pulmonary effort is normal. No respiratory distress.     Breath sounds: Normal breath sounds. No stridor. No wheezing, rhonchi or rales.  Chest:     Chest wall: No  tenderness.  Abdominal:     General: Bowel sounds  are normal. There is no distension.     Palpations: Abdomen is soft.     Tenderness: There is no abdominal tenderness. There is no guarding or rebound.     Hernia: No hernia is present.  Musculoskeletal:     Cervical back: Normal range of motion and neck supple. No rigidity.     Right lower leg: No edema.     Left lower leg: No edema.  Lymphadenopathy:     Cervical: No cervical adenopathy.  Skin:    General: Skin is warm.     Coloration: Skin is not jaundiced or pale.     Findings: No bruising, erythema, lesion or rash.  Neurological:     General: No focal deficit present.     Mental Status: She is alert and oriented to person, place, and time. Mental status is at baseline.     Motor: No weakness.     Coordination: Coordination normal.     Gait: Gait normal.     Deep Tendon Reflexes: Reflexes normal.  Psychiatric:        Mood and Affect: Mood normal.        Behavior: Behavior normal.        Thought Content: Thought content normal.        Judgment: Judgment normal.           Assessment & Plan:  General medical exam - Plan: CBC with Differential/Platelet, COMPLETE METABOLIC PANEL WITH GFR, Lipid panel, MM Digital Screening  Postmenopausal estrogen deficiency - Plan: DG Bone Density  Goiter - Plan: US THYROID, TSH  Malignant neoplasm of nipple of left breast in female, unspecified estrogen receptor status (HCC)  Smoking  Benign essential HTN  Strongly recommended smoking cessation.  Patient is in the precontemplative phase.  She tried Chantix in the past but had terrible nightmares from it.  Blood pressure is elevated.  I recommended starting losartan 50 mg a day and rechecking blood pressure in 2 weeks.  Schedule the patient for a bone density test along with a mammogram.  Physical exam is significant for a goiter.  I recommended an ultrasound of the thyroid along with a TSH.  Screen the patient with a CBC, CMP, and  fasting lipid panel.  Colonoscopy and Pap smear up-to-date.  Regular anticipatory guidance is provided.  Patient's immunizations are up-to-date.

## 2020-04-09 LAB — COMPLETE METABOLIC PANEL WITH GFR
AG Ratio: 1.8 (calc) (ref 1.0–2.5)
ALT: 12 U/L (ref 6–29)
AST: 11 U/L (ref 10–35)
Albumin: 4.3 g/dL (ref 3.6–5.1)
Alkaline phosphatase (APISO): 60 U/L (ref 37–153)
BUN: 10 mg/dL (ref 7–25)
CO2: 31 mmol/L (ref 20–32)
Calcium: 9.5 mg/dL (ref 8.6–10.4)
Chloride: 106 mmol/L (ref 98–110)
Creat: 0.53 mg/dL (ref 0.50–0.99)
GFR, Est African American: 120 mL/min/{1.73_m2} (ref >=60)
GFR, Est Non African American: 103 mL/min/{1.73_m2} (ref >=60)
Globulin: 2.4 g/dL (calc) (ref 1.9–3.7)
Glucose, Bld: 89 mg/dL (ref 65–99)
Potassium: 4 mmol/L (ref 3.5–5.3)
Sodium: 143 mmol/L (ref 135–146)
Total Bilirubin: 0.7 mg/dL (ref 0.2–1.2)
Total Protein: 6.7 g/dL (ref 6.1–8.1)

## 2020-04-09 LAB — TSH: TSH: 0.17 mIU/L — ABNORMAL LOW (ref 0.40–4.50)

## 2020-04-09 LAB — TEST AUTHORIZATION

## 2020-04-09 LAB — CBC WITH DIFFERENTIAL/PLATELET
Absolute Monocytes: 530 cells/uL (ref 200–950)
Basophils Absolute: 68 cells/uL (ref 0–200)
Basophils Relative: 1 %
Eosinophils Absolute: 476 cells/uL (ref 15–500)
Eosinophils Relative: 7 %
HCT: 43.4 % (ref 35.0–45.0)
Hemoglobin: 14.9 g/dL (ref 11.7–15.5)
Lymphs Abs: 1761 cells/uL (ref 850–3900)
MCH: 33.1 pg — ABNORMAL HIGH (ref 27.0–33.0)
MCHC: 34.3 g/dL (ref 32.0–36.0)
MCV: 96.4 fL (ref 80.0–100.0)
MPV: 10.1 fL (ref 7.5–12.5)
Monocytes Relative: 7.8 %
Neutro Abs: 3964 cells/uL (ref 1500–7800)
Neutrophils Relative %: 58.3 %
Platelets: 310 10*3/uL (ref 140–400)
RBC: 4.5 10*6/uL (ref 3.80–5.10)
RDW: 12.1 % (ref 11.0–15.0)
Total Lymphocyte: 25.9 %
WBC: 6.8 10*3/uL (ref 3.8–10.8)

## 2020-04-09 LAB — LIPID PANEL
Cholesterol: 189 mg/dL (ref <200)
HDL: 62 mg/dL (ref >=50)
LDL Cholesterol (Calc): 111 mg/dL (calc) — ABNORMAL HIGH
Non-HDL Cholesterol (Calc): 127 mg/dL (calc) (ref <130)
Total CHOL/HDL Ratio: 3 (calc) (ref <5.0)
Triglycerides: 71 mg/dL (ref <150)

## 2020-04-09 LAB — T4, FREE: Free T4: 1.2 ng/dL (ref 0.8–1.8)

## 2020-05-17 ENCOUNTER — Other Ambulatory Visit: Payer: Self-pay

## 2020-05-17 ENCOUNTER — Encounter (HOSPITAL_COMMUNITY): Payer: Self-pay

## 2020-05-17 ENCOUNTER — Ambulatory Visit (HOSPITAL_COMMUNITY)
Admission: RE | Admit: 2020-05-17 | Discharge: 2020-05-17 | Disposition: A | Discharge status: Home / Self Care | Payer: BC Managed Care – PPO | Source: Ambulatory Visit | Attending: Adult Health | Admitting: Adult Health

## 2020-05-17 DIAGNOSIS — F17200 Nicotine dependence, unspecified, uncomplicated: Secondary | ICD-10-CM

## 2020-05-17 DIAGNOSIS — Z0389 Encounter for observation for other suspected diseases and conditions ruled out: Secondary | ICD-10-CM | POA: Diagnosis not present

## 2020-05-17 IMAGING — MG DIGITAL SCREENING BILATERAL MAMMOGRAM WITH CAD
4 series · 4 of 4 positions shown · non-contrast
Comparison: Previous exam(s).

CLINICAL DATA: Screening.

EXAM:
DIGITAL SCREENING BILATERAL MAMMOGRAM WITH CAD

[R MLO]
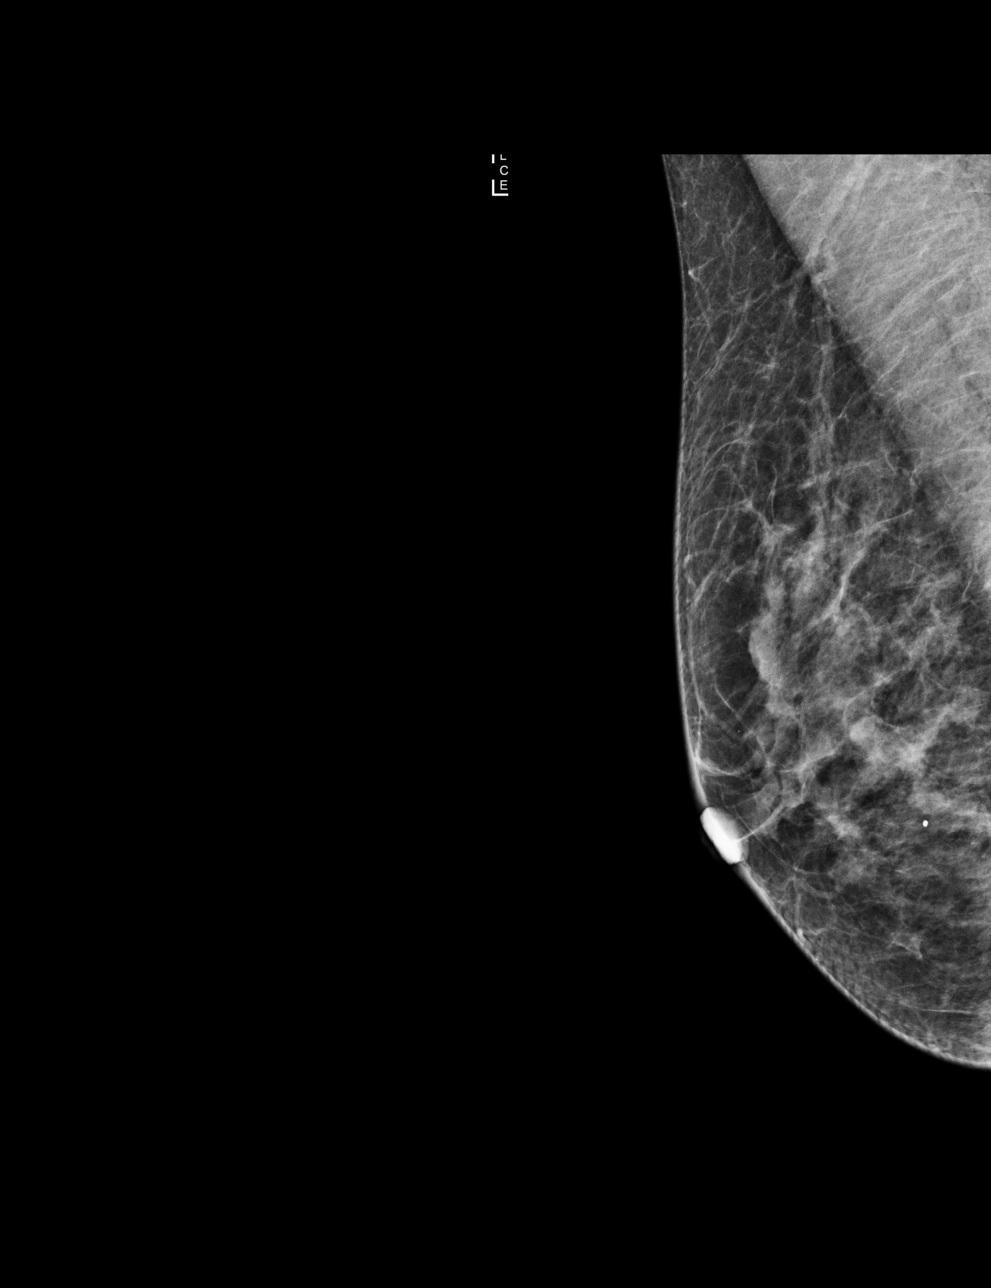

[L CC]
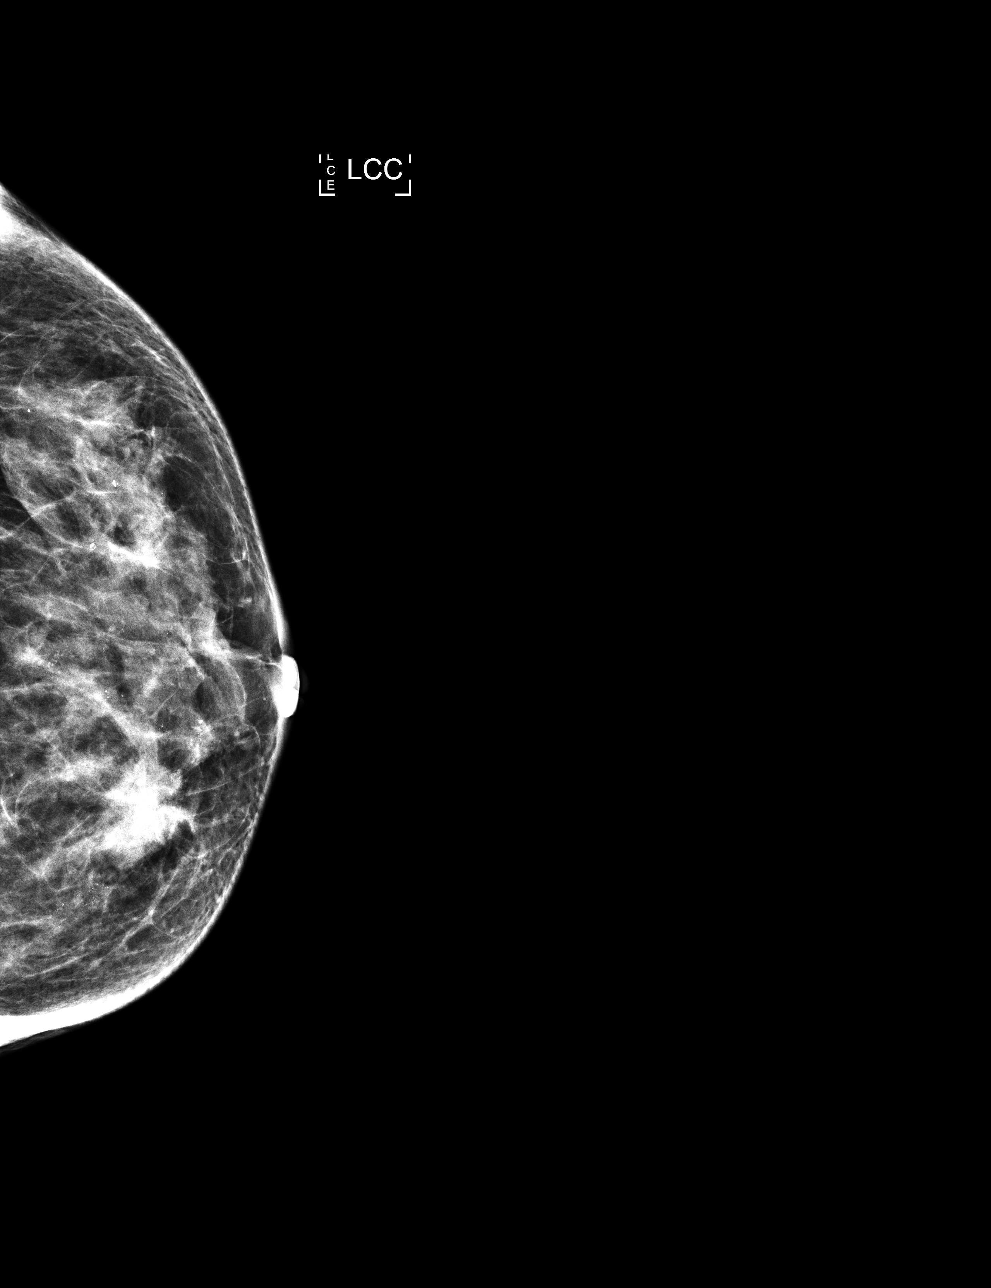

[L MLO]
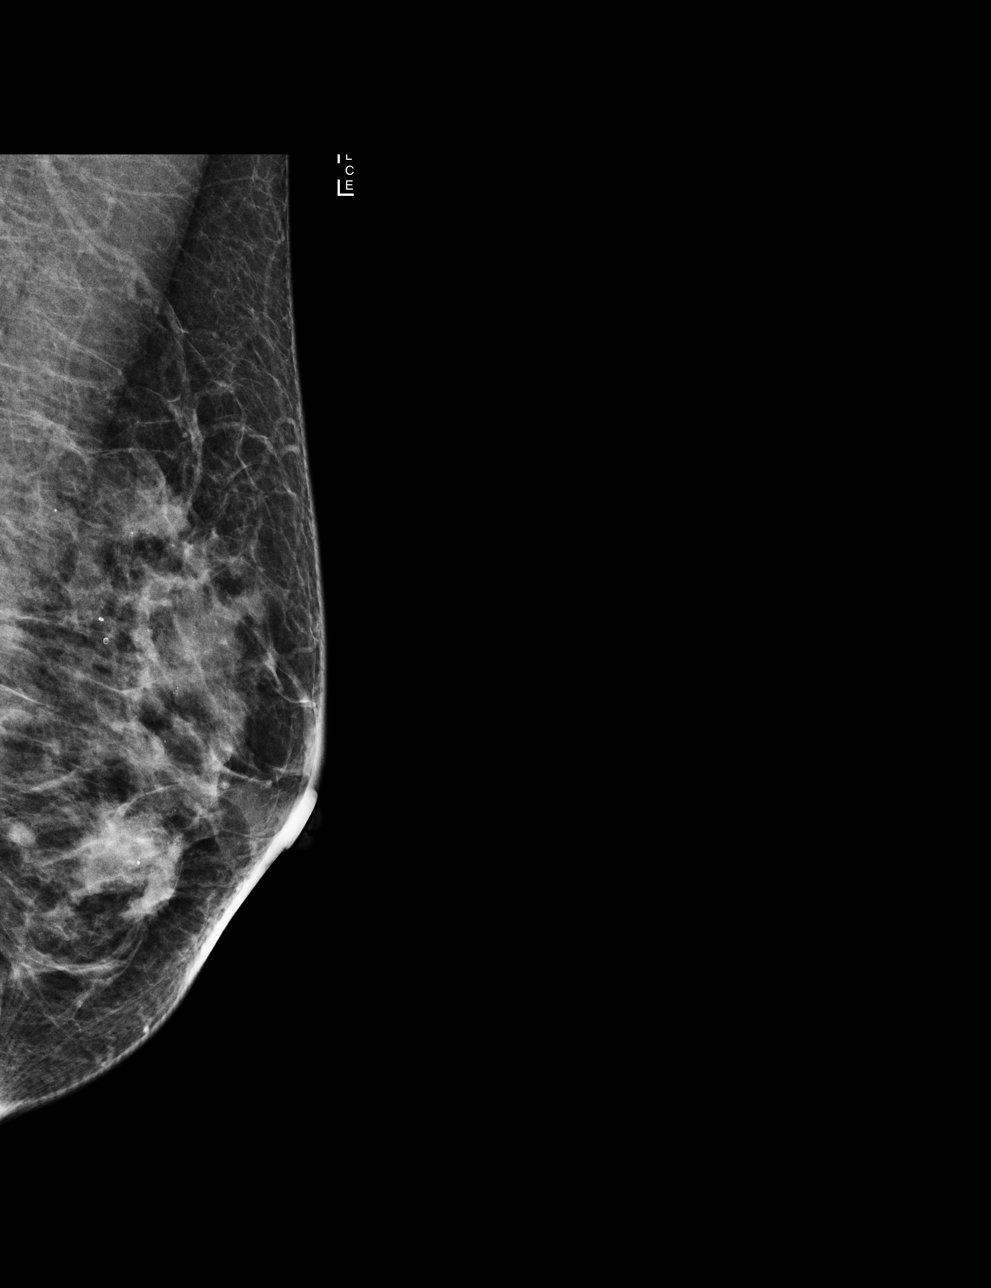

[R CC]
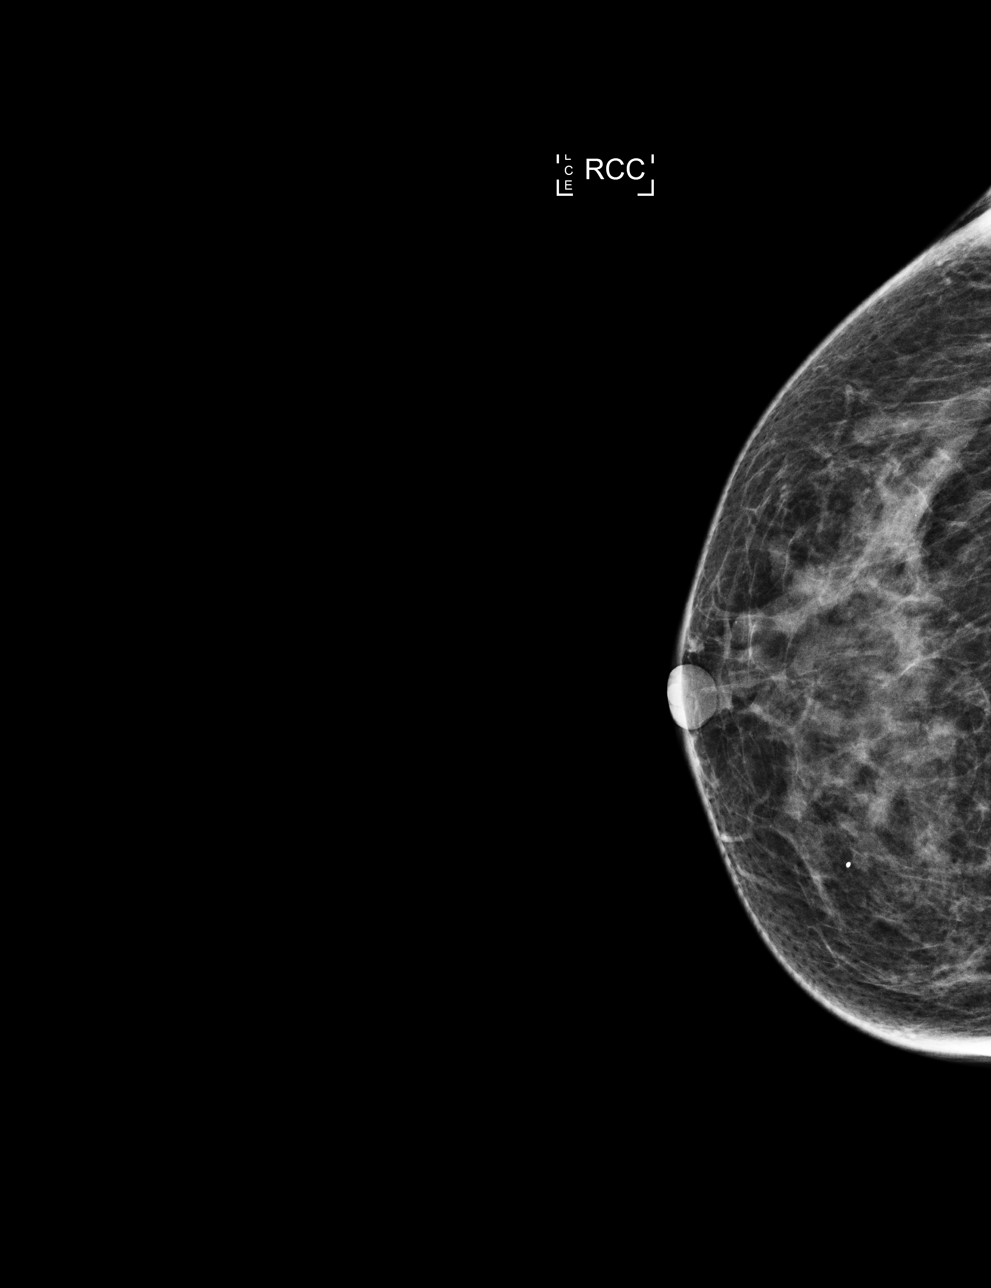

[4 of 4 positions shown; findings below may reference images not displayed]

ACR Breast Density Category c: The breast tissue is heterogeneously
dense, which may obscure small masses.
FINDINGS: In the left breast, a possible mass warrants further evaluation. In
the right breast, no findings suspicious for malignancy.

Images were processed with CAD.
IMPRESSION: Further evaluation is suggested for possible mass in the left
breast.

RECOMMENDATION:
Diagnostic mammogram and possibly ultrasound of the left breast.
(Code:[id number])

The patient will be contacted regarding the findings, and additional
imaging will be scheduled.

BI-RADS CATEGORY  0: Incomplete. Need additional imaging evaluation
and/or prior mammograms for comparison.

## 2020-05-24 ENCOUNTER — Ambulatory Visit
Admission: RE | Admit: 2020-05-24 | Discharge: 2020-05-24 | Disposition: A | Discharge status: Home / Self Care | Payer: BC Managed Care – PPO | Source: Ambulatory Visit | Attending: Family Medicine | Admitting: Family Medicine

## 2020-05-24 ENCOUNTER — Other Ambulatory Visit: Payer: Self-pay

## 2020-05-24 DIAGNOSIS — Z0000 Encounter for general adult medical examination without abnormal findings: Secondary | ICD-10-CM

## 2020-05-24 DIAGNOSIS — Z1231 Encounter for screening mammogram for malignant neoplasm of breast: Secondary | ICD-10-CM | POA: Diagnosis not present

## 2020-05-24 DIAGNOSIS — E041 Nontoxic single thyroid nodule: Secondary | ICD-10-CM | POA: Diagnosis not present

## 2020-05-24 DIAGNOSIS — E049 Nontoxic goiter, unspecified: Secondary | ICD-10-CM

## 2020-05-24 DIAGNOSIS — Z780 Asymptomatic menopausal state: Secondary | ICD-10-CM | POA: Diagnosis not present

## 2020-05-24 DIAGNOSIS — M8589 Other specified disorders of bone density and structure, multiple sites: Secondary | ICD-10-CM | POA: Diagnosis not present

## 2020-05-25 NOTE — Progress Notes (Signed)
RN notified patient of imaging results, no further needs at this time.

## 2020-05-26 ENCOUNTER — Encounter: Payer: Self-pay | Admitting: Family Medicine

## 2020-05-26 DIAGNOSIS — M810 Age-related osteoporosis without current pathological fracture: Secondary | ICD-10-CM | POA: Insufficient documentation

## 2020-05-26 DIAGNOSIS — M8580 Other specified disorders of bone density and structure, unspecified site: Secondary | ICD-10-CM | POA: Insufficient documentation

## 2020-05-26 DIAGNOSIS — E042 Nontoxic multinodular goiter: Secondary | ICD-10-CM | POA: Insufficient documentation

## 2020-06-25 ENCOUNTER — Other Ambulatory Visit: Payer: Self-pay | Admitting: Oncology

## 2020-09-23 ENCOUNTER — Other Ambulatory Visit: Payer: Self-pay | Admitting: Family Medicine

## 2020-12-13 ENCOUNTER — Telehealth: Payer: Self-pay | Admitting: Hematology and Oncology

## 2020-12-13 NOTE — Telephone Encounter (Signed)
R/s appt per 3/29 sch msg. Pt aware.  

## 2020-12-29 ENCOUNTER — Ambulatory Visit: Payer: BC Managed Care – PPO | Admitting: Hematology and Oncology

## 2021-01-16 NOTE — Assessment & Plan Note (Signed)
05/26/2019:Left mastectomy Victoria Richardson): two malignant tumors, First tumor: IDC grade 2, 2.2cm, and IDC grade 3, 0.5cm, clear margins, 8 lymph nodes negative for carcinoma, ER 100%, PR 0%, Ki-67 5%; second tumor: Grade 3 IDC 0.5 cm, margins negative, 0/2 lymph nodes, ER 100%, PR 5%, Ki-67 30%, HER-2 -1+  Treatment plan: 1.Oncotype DX score: 20: ROR: 6% 2. Adjuvant antiestrogen therapywith anastrozole 1 mg daily x5 to 7 years started  Her momRena Purcellis also a patient of mine and breast cancer survivor.  (She is in the hospital with upper GI bleed) Genetic testing: Negative  Anastrozole Toxicities: Occasional hot flashes but denies any arthralgias or myalgias. Her fingers feel tight in the morning but they get better through the day.  Breast Cancer Surveillance: 1. Mammograms 05/25/20: Right mammogram, Density Cat C. 2. Breast Exam: 01/16/21: Benign  Return to clinic in 1 year for follow-up

## 2021-01-16 NOTE — Progress Notes (Signed)
Patient Care Team: Victoria Frizzle, MD as PCP - General (Family Medicine) Victoria Bookbinder, MD as Consulting Physician (General Surgery) Victoria Lose, MD as Consulting Physician (Hematology and Oncology) Victoria Rudd, MD as Consulting Physician (Radiation Oncology)  DIAGNOSIS:    ICD-10-CM   1. Malignant neoplasm of upper-inner quadrant of left breast in female, estrogen receptor positive ([place])  C50.212    Z17.0     SUMMARY OF ONCOLOGIC HISTORY: Oncology History  Malignant neoplasm of upper-inner quadrant of left breast in female, estrogen receptor positive ([place])  05/06/2019 Initial Diagnosis   Routine screening mammogram detected an irregular left breast mass at the 9 o'clock position, 2.5cm, with a 0.5cm ill-defined adjacent mass and 1.0cm line of suspicious calcifications inferior and medial to the primary mass. No evidence of left axillary adenopathy. Biopsy showed IDC, grade 2-3, intermediate grade DCIS, HER-2 - by FISH, ER+ 100%, PR -, Ki67 20%.   05/21/2019 Genetic Testing   Negative genetic testing on the [facility] Common Hereditary Cancers Panel + Renal Cancer Panel. The Common Hereditary Cancers Panel offered by [facility] includes sequencing and/or deletion duplication testing of the following 48 genes: APC, ATM, AXIN2, BARD1, BMPR1A, BRCA1, BRCA2, BRIP1, CDH1, CDKN2A (p14ARF), CDKN2A (p16INK4a), CKD4, CHEK2, CTNNA1, DICER1, EPCAM (Deletion/duplication testing only), GREM1 (promoter region deletion/duplication testing only), KIT, MEN1, MLH1, MSH2, MSH3, MSH6, MUTYH, NBN, NF1, NHTL1, PALB2, PDGFRA, PMS2, POLD1, POLE, PTEN, RAD50, RAD51C, RAD51D, RNF43, SDHB, SDHC, SDHD, SMAD4, SMARCA4. STK11, TP53, TSC1, TSC2, and VHL.  The following genes were evaluated for sequence changes only: SDHA and HOXB13 c.251G>A variant only. The [facility] Renal/Urinary Tract Cancers Panel analyzes the following 24 genes:BAP1 ,CDC73, CDKN1C, DICER1, DIS3L2, EPCAM, FH, FLCN, GPC3, MET, MLH1, MSH2, MSH6, PMS2,  PTEN, SDHB, SDHC, SMARCA4, SMARCB1, TP53, TSC1, TSC2, VHL, WT1. The report date is 05/21/2019.   05/26/2019 Surgery   Left mastectomy Victoria Richardson) [phone]): two malignant tumors, First tumor: IDC grade 2, 2.2cm, and IDC grade 3, 0.5cm, clear margins, 8 lymph nodes negative for carcinoma, ER 100%, PR 0%, Ki-67 5%; second tumor: Grade 3 IDC 0.5 cm, margins negative, 0/2 lymph nodes, ER 100%, PR 5%, Ki-67 30%, HER-2 -1+   05/26/2019 Oncotype testing   The Oncotype DX score was 20 predicting a risk of outside the breast recurrence over the next 9 years of 6 % if the patient's only systemic therapy is tamoxifen for 5 years.    06/02/2019 Cancer Staging   Staging form: Breast, AJCC 8th Edition - Pathologic stage from 06/02/2019: Stage IIA (pT2, pN0, cM0, G2, ER+, PR-, HER2-) - Signed by Victoria Lose, MD on 06/02/2019   06/2019 - 06/2024 Anti-estrogen oral therapy   Anastrozole $RemoveBefo'1mg'oYqNYvmaUbr$ /daily      CHIEF COMPLIANT: Follow-up of left breast cancer on anastrozole  INTERVAL HISTORY: Victoria Richardson is a 60 y.o. with above-mentioned history of left breast cancer treated with mastectomy and who is currently on antiestrogen therapy with anastrozole. Mammogram on 05/24/20 showed no evidence of malignancy bilaterally. Bone density scan on 05/24/20 showed osteopenia with a T-score of -1.8. She presents to the clinic today for follow-up.  She continues to have joint stiffness in the hands but is gotten better with CBD oil.  Her right hip though has not improved.  It comes on and off with intermittent pain and discomfort.  She also feels very tight in the left chest wall into the axilla.  ALLERGIES:  has No Known Allergies.  MEDICATIONS:  Current Outpatient Medications  Medication Sig Dispense Refill  . anastrozole (  ARIMIDEX) 1 MG tablet TAKE 1 TABLET(1 MG) BY MOUTH DAILY 90 tablet 3  . Ascorbic Acid (VITAMIN C PO) Take 1 tablet by mouth daily.     Marland Kitchen losartan (COZAAR) 50 MG tablet TAKE 1 TABLET(50 MG) BY MOUTH  DAILY 30 tablet 5  . Multiple Vitamin (MULTIVITAMIN) capsule Take 1 capsule by mouth daily.     No current facility-administered medications for this visit.    PHYSICAL EXAMINATION: ECOG PERFORMANCE STATUS: 1 - Symptomatic but completely ambulatory  Vitals:   01/17/21 1012  BP: (!) 135/96  Pulse: 72  Resp: 18  Temp: 98 F (36.7 C)  SpO2: 99%   Filed Weights   01/17/21 1012  Weight: 109 lb 9.6 oz (49.7 kg)    BREAST: Left mastectomy.  Scar is palpable without any lumps or nodules of concern.  Right breast no palpable lumps or nodules.. (exam performed in the presence of a chaperone)  LABORATORY DATA:  I have reviewed the data as listed CMP Latest Ref Rng & Units 04/04/2020 05/21/2019 05/13/2019  Glucose 65 - 99 mg/dL 89 122(H) 100(H)  BUN 7 - 25 mg/dL $Remove'10 9 8  'aEmZbBi$ Creatinine 0.50 - 0.99 mg/dL 0.53 0.55 0.66  Sodium 135 - 146 mmol/L 143 139 144  Potassium 3.5 - 5.3 mmol/L 4.0 3.0(L) 3.0(LL)  Chloride 98 - 110 mmol/L 106 101 103  CO2 20 - 32 mmol/L $RemoveB'31 26 31  'TNYYxKkB$ Calcium 8.6 - 10.4 mg/dL 9.5 9.1 9.3  Total Protein 6.1 - 8.1 g/dL 6.7 - 7.2  Total Bilirubin 0.2 - 1.2 mg/dL 0.7 - 0.9  Alkaline Phos 38 - 126 U/L - - 66  AST 10 - 35 U/L 11 - 17  ALT 6 - 29 U/L 12 - 17    Lab Results  Component Value Date   WBC 6.8 04/04/2020   HGB 14.9 04/04/2020   HCT 43.4 04/04/2020   MCV 96.4 04/04/2020   PLT 310 04/04/2020   NEUTROABS 3,964 04/04/2020    ASSESSMENT & PLAN:  Malignant neoplasm of upper-inner quadrant of left breast in female, estrogen receptor positive ([place]) 05/26/2019:Left mastectomy Victoria Richardson): two malignant tumors, First tumor: IDC grade 2, 2.2cm, and IDC grade 3, 0.5cm, clear margins, 8 lymph nodes negative for carcinoma, ER 100%, PR 0%, Ki-67 5%; second tumor: Grade 3 IDC 0.5 cm, margins negative, 0/2 lymph nodes, ER 100%, PR 5%, Ki-67 30%, HER-2 -1+  Treatment plan: 1.Oncotype DX score: 20: ROR: 6% 2. Adjuvant antiestrogen therapywith anastrozole 1 mg daily x5 to 7  years started  Her momRena Richardson also a patient of mine and breast cancer survivor (in January 2022 she had COVID-19 pneumonia, multiple UTIs: She is getting better now) Genetic testing: Negative  Anastrozole Toxicities: Occasional hot flashes   Joint stiffness especially in the hands: Improved with CBD oil Right hip pain: Encouraged her to stretch.  Breast Cancer Surveillance: 1. Mammograms 05/25/20: Right mammogram, Density Cat C. 2. Breast Exam: 01/16/21: Benign  Return to clinic in 1 year for follow-up     No orders of the defined types were placed in this encounter.  The patient has a good understanding of the overall plan. she agrees with it. she will call with any problems that may develop before the next visit here.  Total time spent: 20 mins including face to face time and time spent for planning, charting and coordination of care  Rulon Eisenmenger, MD, MPH 01/17/2021  I, Cloyde Reams Dorshimer, am acting as scribe for Dr. Nicholas Richardson.  I  have reviewed the above documentation for accuracy and completeness, and I agree with the above.

## 2021-01-17 ENCOUNTER — Other Ambulatory Visit: Payer: Self-pay

## 2021-01-17 ENCOUNTER — Inpatient Hospital Stay: Payer: BC Managed Care – PPO | Attending: Hematology and Oncology | Admitting: Hematology and Oncology

## 2021-01-17 DIAGNOSIS — C50212 Malignant neoplasm of upper-inner quadrant of left female breast: Secondary | ICD-10-CM | POA: Diagnosis not present

## 2021-01-17 DIAGNOSIS — Z9012 Acquired absence of left breast and nipple: Secondary | ICD-10-CM | POA: Diagnosis not present

## 2021-01-17 DIAGNOSIS — M25551 Pain in right hip: Secondary | ICD-10-CM | POA: Insufficient documentation

## 2021-01-17 DIAGNOSIS — M8580 Other specified disorders of bone density and structure, unspecified site: Secondary | ICD-10-CM | POA: Insufficient documentation

## 2021-01-17 DIAGNOSIS — Z170 Estrogen receptor positive status [ER+]: Secondary | ICD-10-CM | POA: Diagnosis not present

## 2021-01-17 DIAGNOSIS — M25641 Stiffness of right hand, not elsewhere classified: Secondary | ICD-10-CM | POA: Diagnosis not present

## 2021-01-17 DIAGNOSIS — M25642 Stiffness of left hand, not elsewhere classified: Secondary | ICD-10-CM | POA: Insufficient documentation

## 2021-01-17 MED ORDER — ANASTROZOLE 1 MG PO TABS: 1.0000 mg | ORAL_TABLET | Freq: Every day | ORAL | 3 refills | Status: DC

## 2021-03-22 ENCOUNTER — Other Ambulatory Visit: Payer: Self-pay | Admitting: Family Medicine

## 2021-04-20 ENCOUNTER — Encounter: Payer: Self-pay | Admitting: Family Medicine

## 2021-04-20 ENCOUNTER — Ambulatory Visit (INDEPENDENT_AMBULATORY_CARE_PROVIDER_SITE_OTHER): Payer: BC Managed Care – PPO | Admitting: Family Medicine

## 2021-04-20 ENCOUNTER — Other Ambulatory Visit: Payer: Self-pay

## 2021-04-20 VITALS — BP 135/86 | HR 88 | Temp 98.7°F | Resp 14 | Ht 64.0 in | Wt 111.4 lb

## 2021-04-20 DIAGNOSIS — E0590 Thyrotoxicosis, unspecified without thyrotoxic crisis or storm: Secondary | ICD-10-CM

## 2021-04-20 DIAGNOSIS — C50012 Malignant neoplasm of nipple and areola, left female breast: Secondary | ICD-10-CM

## 2021-04-20 DIAGNOSIS — I10 Essential (primary) hypertension: Secondary | ICD-10-CM

## 2021-04-20 DIAGNOSIS — F17200 Nicotine dependence, unspecified, uncomplicated: Secondary | ICD-10-CM

## 2021-04-20 DIAGNOSIS — E042 Nontoxic multinodular goiter: Secondary | ICD-10-CM | POA: Diagnosis not present

## 2021-04-20 DIAGNOSIS — I2510 Atherosclerotic heart disease of native coronary artery without angina pectoris: Secondary | ICD-10-CM | POA: Diagnosis not present

## 2021-04-20 DIAGNOSIS — Z122 Encounter for screening for malignant neoplasm of respiratory organs: Secondary | ICD-10-CM

## 2021-04-20 MED ORDER — ROSUVASTATIN CALCIUM 20 MG PO TABS: 20.0000 mg | ORAL_TABLET | Freq: Every day | ORAL | 3 refills | Status: DC

## 2021-04-20 MED ORDER — LOSARTAN POTASSIUM 50 MG PO TABS: ORAL_TABLET | ORAL | 1 refills | Status: DC

## 2021-04-20 NOTE — Progress Notes (Signed)
Subjective:    Patient ID: Victoria Richardson, female    DOB: 22-Apr-1961, 60 y.o.   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  She underwent a simple mastectomy.  Thankfully lymph nodes were negative for any malignant spread.  Colonoscopy was last performed in 2019.  This showed diverticulosis but was otherwise normal and she is not due again for 10 years.  Her last Pap smear was performed in 2020 and was normal.  She is not due for repeat Pap smear until 2023.    CT chest 8/21- FINDINGS: Cardiovascular: The heart is normal in size. No pericardial effusion.   No evidence of thoracic aortic aneurysm. Mild atherosclerotic calcifications of the aortic arch.   Mild coronary atherosclerosis of the left main coronary artery and left circumflex.   Mediastinum/Nodes: No suspicious mediastinal lymphadenopathy.   Status post left axillary lymph node dissection.   Visualized thyroid is unremarkable.   Lungs/Pleura: Mild biapical pleural-parenchymal scarring.   Mild centrilobular emphysematous changes, upper lung predominant.   No focal consolidation.   Scattered small bilateral pulmonary nodules, including a dominant 6.2 mm subpleural nodule at the right lung base, unchanged.   No pleural effusion or pneumothorax.   Upper Abdomen: Visualized upper abdomen is grossly unremarkable, noting vascular calcifications.   Musculoskeletal: Status post left mastectomy.   Degenerative changes of the visualized thoracolumbar spine.     She is here today for follow-up.  Unfortunately she continues to smoke.  She denies any chest pain shortness of breath or dyspnea on exertion.  Her blood pressure today is 135/86.  She is not currently taking a statin despite the signs of coronary artery disease seen on her CT scan.  She is due to repeat the CT scan of her chest.  On physical exam today the goiter in her neck has not grown.  There  are no palpable nodules or masses but she is due to recheck a TSH for subclinical hyperthyroidism. Past Medical History:  Diagnosis Date   Allergy    Breast cancer in female [facility])    Left   Diverticulitis    Family history of adverse reaction to anesthesia    Mom has post-op n/v   Family history of breast cancer    Family history of colon cancer    Family history of kidney cancer    Family history of prostate cancer    Multinodular goiter    Osteopenia    Past Surgical History:  Procedure Laterality Date   ADENOIDECTOMY     BREAST BIOPSY     BREAST SURGERY N/A    [employer]reesia 04/01/2020   COLONOSCOPY     DILATION AND CURETTAGE OF UTERUS     MASTECTOMY     MASTECTOMY W/ SENTINEL NODE BIOPSY Left 05/26/2019   Procedure: LEFT MASTECTOMY WITH LEFT AXILLARY SENTINEL LYMPH NODE BIOPSY;  Surgeon: Rolm Bookbinder, MD;  Location: [facility];  Service: General;  Laterality: Left;   tubaligation     Current Outpatient Medications on File Prior to Visit  Medication Sig Dispense Refill   anastrozole (ARIMIDEX) 1 MG tablet Take 1 tablet (1 mg total) by mouth daily. 90 tablet 3   Ascorbic Acid (VITAMIN C PO) Take 1 tablet by mouth daily.      losartan (COZAAR) 50 MG tablet TAKE 1 TABLET(50 MG) BY MOUTH DAILY 30 tablet 0   Multiple Vitamin (MULTIVITAMIN) capsule Take 1 capsule by mouth daily.  No current facility-administered medications on file prior to visit.   No Known Allergies Social History   Socioeconomic History   Marital status: Married    Spouse name: Not on file   Number of children: Not on file   Years of education: Not on file   Highest education level: Not on file  Occupational History   Not on file  Tobacco Use   Smoking status: Every Day    Packs/day: 1.00    Years: 45.00    Pack years: 45.00    Types: Cigarettes   Smokeless tobacco: Never  Vaping Use   Vaping Use: Never used  Substance and Sexual Activity   Alcohol use: Yes    Alcohol/week: 0.0 standard drinks     Comment: rarely   Drug use: No   Sexual activity: Not Currently  Other Topics Concern   Not on file  Social History Narrative   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   Married.    Smokes---   Social Determinants of Health   Financial Resource Strain: Not on file  Food Insecurity: Not on file  Transportation Needs: Not on file  Physical Activity: Not on file  Stress: Not on file  Social Connections: Not on file  Intimate Partner Violence: Not on file   Family History  Problem Relation Age of Onset   Cancer Mother 59       Breast Cancer   Colon polyps Mother    Heart disease Father 12       CAD   Colon cancer Maternal Grandmother        dx 66s   Kidney cancer Maternal Aunt    Prostate cancer Maternal Uncle    Cancer Maternal Uncle        unk type   Breast cancer Cousin        dx early 108s   Hodgkin's lymphoma Cousin    Esophageal cancer Neg Hx    Rectal cancer Neg Hx    Stomach cancer Neg Hx    Pancreatic cancer Neg Hx      Review of Systems  All other systems reviewed and are negative.     Objective:   Physical Exam Vitals reviewed.  Constitutional:      General: She is not in acute distress.    Appearance: Normal appearance. She is not ill-appearing, toxic-appearing or diaphoretic.  HENT:     Head: Normocephalic and atraumatic.     Right Ear: Tympanic membrane, ear canal and external ear normal. There is no impacted cerumen.     Left Ear: Tympanic membrane, ear canal and external ear normal. There is no impacted cerumen.     Nose: Nose normal. No congestion or rhinorrhea.     Mouth/Throat:     Mouth: Mucous membranes are moist.     Pharynx: Oropharynx is clear.  Eyes:     General: No scleral icterus.       Right eye: No discharge.        Left eye: No discharge.     Extraocular Movements: Extraocular movements intact.     Conjunctiva/sclera: Conjunctivae normal.     Pupils: Pupils are equal, round, and reactive  to light.  Neck:     Thyroid: Thyromegaly present.     Vascular: No carotid bruit.  Cardiovascular:     Rate and Rhythm: Normal rate and regular rhythm.     Pulses: Normal pulses.     Heart sounds: Normal heart  sounds. No murmur heard.   No friction rub. No gallop.  Pulmonary:     Effort: Pulmonary effort is normal. No respiratory distress.     Breath sounds: Normal breath sounds. No stridor. No wheezing, rhonchi or rales.  Chest:     Chest wall: No tenderness.  Abdominal:     General: Bowel sounds are normal. There is no distension.     Palpations: Abdomen is soft.     Tenderness: There is no abdominal tenderness. There is no guarding or rebound.     Hernia: No hernia is present.  Musculoskeletal:     Cervical back: Normal range of motion and neck supple. No rigidity.     Right lower leg: No edema.     Left lower leg: No edema.  Lymphadenopathy:     Cervical: No cervical adenopathy.  Skin:    General: Skin is warm.     Coloration: Skin is not jaundiced or pale.     Findings: No bruising, erythema, lesion or rash.  Neurological:     General: No focal deficit present.     Mental Status: She is alert and oriented to person, place, and time. Mental status is at baseline.     Motor: No weakness.     Coordination: Coordination normal.     Gait: Gait normal.     Deep Tendon Reflexes: Reflexes normal.  Psychiatric:        Mood and Affect: Mood normal.        Behavior: Behavior normal.        Thought Content: Thought content normal.        Judgment: Judgment normal.          Assessment & Plan:  Benign essential HTN  Smoking - Plan: CT CHEST LUNG CA SCREEN LOW DOSE W/O CM  Malignant neoplasm of nipple of left breast in female, unspecified estrogen receptor status ([place])  Coronary artery disease involving native coronary artery of native heart without angina pectoris - Plan: losartan (COZAAR) 50 MG tablet, CBC with Differential/Platelet, COMPLETE METABOLIC PANEL WITH GFR,  Lipid panel, TSH, T4, free  Screening for lung cancer - Plan: CT CHEST LUNG CA SCREEN LOW DOSE W/O CM  Multinodular goiter - Plan: Lipid panel  Subclinical hyperthyroidism - Plan: TSH, T4, free  Strongly recommended smoking cessation.  Patient is in the precontemplative phase.  Blood pressure today is well controlled on losartan.Marland Kitchen  Physical exam is significant for a goiter.  It has not changed in size and consistency from last year.  Last year there was a moderately suspicious nodule that they recommended a repeat ultrasound in 1 year so I will schedule her for a follow-up ultrasound to confirm that that is not changing.  I will also check a TSH to see if she is starting to develop overt hypothyroidism that requires treatment.  Started the patient on a statin for her coronary artery disease.  Recommend Crestor 20 mg a day.  Check CT scan of the lungs to monitor pulmonary nodule and screen for lung cancer

## 2021-04-21 ENCOUNTER — Ambulatory Visit
Admission: RE | Admit: 2021-04-21 | Discharge: 2021-04-21 | Disposition: A | Discharge status: Home / Self Care | Payer: BC Managed Care – PPO | Source: Ambulatory Visit | Attending: Family Medicine | Admitting: Family Medicine

## 2021-04-21 DIAGNOSIS — E042 Nontoxic multinodular goiter: Secondary | ICD-10-CM

## 2021-04-21 DIAGNOSIS — E041 Nontoxic single thyroid nodule: Secondary | ICD-10-CM | POA: Diagnosis not present

## 2021-04-21 LAB — COMPLETE METABOLIC PANEL WITH GFR
AG Ratio: 1.7 (calc) (ref 1.0–2.5)
ALT: 13 U/L (ref 6–29)
AST: 13 U/L (ref 10–35)
Albumin: 4.3 g/dL (ref 3.6–5.1)
Alkaline phosphatase (APISO): 62 U/L (ref 37–153)
BUN: 12 mg/dL (ref 7–25)
CO2: 31 mmol/L (ref 20–32)
Calcium: 9.7 mg/dL (ref 8.6–10.4)
Chloride: 104 mmol/L (ref 98–110)
Creat: 0.55 mg/dL (ref 0.50–1.05)
Globulin: 2.5 g/dL (calc) (ref 1.9–3.7)
Glucose, Bld: 88 mg/dL (ref 65–99)
Potassium: 4.3 mmol/L (ref 3.5–5.3)
Sodium: 141 mmol/L (ref 135–146)
Total Bilirubin: 0.6 mg/dL (ref 0.2–1.2)
Total Protein: 6.8 g/dL (ref 6.1–8.1)
eGFR: 104 mL/min/{1.73_m2} (ref >=60)

## 2021-04-21 LAB — CBC WITH DIFFERENTIAL/PLATELET
Absolute Monocytes: 588 cells/uL (ref 200–950)
Basophils Absolute: 59 cells/uL (ref 0–200)
Basophils Relative: 0.6 %
Eosinophils Absolute: 363 cells/uL (ref 15–500)
Eosinophils Relative: 3.7 %
HCT: 44.9 % (ref 35.0–45.0)
Hemoglobin: 14.9 g/dL (ref 11.7–15.5)
Lymphs Abs: 2029 cells/uL (ref 850–3900)
MCH: 32.7 pg (ref 27.0–33.0)
MCHC: 33.2 g/dL (ref 32.0–36.0)
MCV: 98.5 fL (ref 80.0–100.0)
MPV: 10 fL (ref 7.5–12.5)
Monocytes Relative: 6 %
Neutro Abs: 6762 cells/uL (ref 1500–7800)
Neutrophils Relative %: 69 %
Platelets: 341 10*3/uL (ref 140–400)
RBC: 4.56 10*6/uL (ref 3.80–5.10)
RDW: 12.1 % (ref 11.0–15.0)
Total Lymphocyte: 20.7 %
WBC: 9.8 10*3/uL (ref 3.8–10.8)

## 2021-04-21 LAB — T4, FREE: Free T4: 1.2 ng/dL (ref 0.8–1.8)

## 2021-04-21 LAB — LIPID PANEL
Cholesterol: 207 mg/dL — ABNORMAL HIGH (ref <200)
HDL: 65 mg/dL (ref >=50)
LDL Cholesterol (Calc): 121 mg/dL (calc) — ABNORMAL HIGH
Non-HDL Cholesterol (Calc): 142 mg/dL (calc) — ABNORMAL HIGH (ref <130)
Total CHOL/HDL Ratio: 3.2 (calc) (ref <5.0)
Triglycerides: 107 mg/dL (ref <150)

## 2021-04-21 LAB — TSH: TSH: 0.14 mIU/L — ABNORMAL LOW (ref 0.40–4.50)

## 2021-05-08 ENCOUNTER — Ambulatory Visit: Payer: BC Managed Care – PPO

## 2021-05-23 ENCOUNTER — Other Ambulatory Visit: Payer: Self-pay

## 2021-05-23 ENCOUNTER — Ambulatory Visit
Admission: RE | Admit: 2021-05-23 | Discharge: 2021-05-23 | Disposition: A | Discharge status: Home / Self Care | Payer: BC Managed Care – PPO | Source: Ambulatory Visit | Attending: Family Medicine | Admitting: Family Medicine

## 2021-05-23 DIAGNOSIS — Z122 Encounter for screening for malignant neoplasm of respiratory organs: Secondary | ICD-10-CM

## 2021-05-23 DIAGNOSIS — F17210 Nicotine dependence, cigarettes, uncomplicated: Secondary | ICD-10-CM | POA: Diagnosis not present

## 2021-05-23 DIAGNOSIS — F17200 Nicotine dependence, unspecified, uncomplicated: Secondary | ICD-10-CM

## 2021-06-26 ENCOUNTER — Other Ambulatory Visit: Payer: Self-pay | Admitting: Adult Health

## 2021-06-28 ENCOUNTER — Ambulatory Visit (INDEPENDENT_AMBULATORY_CARE_PROVIDER_SITE_OTHER): Payer: BC Managed Care – PPO | Admitting: *Deleted

## 2021-06-28 ENCOUNTER — Other Ambulatory Visit: Payer: Self-pay

## 2021-06-28 DIAGNOSIS — Z23 Encounter for immunization: Secondary | ICD-10-CM | POA: Diagnosis not present

## 2021-07-20 ENCOUNTER — Other Ambulatory Visit: Payer: Self-pay | Admitting: Family Medicine

## 2021-07-20 DIAGNOSIS — I2510 Atherosclerotic heart disease of native coronary artery without angina pectoris: Secondary | ICD-10-CM

## 2021-09-18 DIAGNOSIS — U071 COVID-19: Secondary | ICD-10-CM | POA: Diagnosis not present

## 2021-09-18 DIAGNOSIS — R6889 Other general symptoms and signs: Secondary | ICD-10-CM | POA: Diagnosis not present

## 2021-10-10 ENCOUNTER — Other Ambulatory Visit: Payer: Self-pay | Admitting: Family Medicine

## 2021-10-10 DIAGNOSIS — Z1231 Encounter for screening mammogram for malignant neoplasm of breast: Secondary | ICD-10-CM

## 2021-10-19 ENCOUNTER — Ambulatory Visit
Admission: RE | Admit: 2021-10-19 | Discharge: 2021-10-19 | Disposition: A | Discharge status: Home / Self Care | Payer: BC Managed Care – PPO | Source: Ambulatory Visit | Attending: Family Medicine | Admitting: Family Medicine

## 2021-10-19 ENCOUNTER — Other Ambulatory Visit: Payer: Self-pay

## 2021-10-19 DIAGNOSIS — Z1231 Encounter for screening mammogram for malignant neoplasm of breast: Secondary | ICD-10-CM

## 2021-10-23 ENCOUNTER — Other Ambulatory Visit: Payer: Self-pay | Admitting: Family Medicine

## 2021-10-23 DIAGNOSIS — I2510 Atherosclerotic heart disease of native coronary artery without angina pectoris: Secondary | ICD-10-CM

## 2022-01-03 NOTE — Progress Notes (Incomplete)
? ?Patient Care Team: ?Susy Frizzle, MD as PCP - General (Family Medicine) ?Rolm Bookbinder, MD as Consulting Physician (General Surgery) ?Nicholas Lose, MD as Consulting Physician (Hematology and Oncology) ?Kyung Rudd, MD as Consulting Physician (Radiation Oncology) ? ?DIAGNOSIS: No diagnosis found. ? ?SUMMARY OF ONCOLOGIC HISTORY: ?Oncology History  ?Malignant neoplasm of upper-inner quadrant of left breast in female, estrogen receptor positive ([place])  ?05/06/2019 Initial Diagnosis  ? Routine screening mammogram detected an irregular left breast mass at the 9 o'clock position, 2.5cm, with a 0.5cm ill-defined adjacent mass and 1.0cm line of suspicious calcifications inferior and medial to the primary mass. No evidence of left axillary adenopathy. Biopsy showed IDC, grade 2-3, intermediate grade DCIS, HER-2 - by FISH, ER+ 100%, PR -, Ki67 20%. ?  ?05/21/2019 Genetic Testing  ? Negative genetic testing on the [facility] Common Hereditary Cancers Panel + Renal Cancer Panel. The Common Hereditary Cancers Panel offered by [facility] includes sequencing and/or deletion duplication testing of the following 48 genes: APC, ATM, AXIN2, BARD1, BMPR1A, BRCA1, BRCA2, BRIP1, CDH1, CDKN2A (p14ARF), CDKN2A (p16INK4a), CKD4, CHEK2, CTNNA1, DICER1, EPCAM (Deletion/duplication testing only), GREM1 (promoter region deletion/duplication testing only), KIT, MEN1, MLH1, MSH2, MSH3, MSH6, MUTYH, NBN, NF1, NHTL1, PALB2, PDGFRA, PMS2, POLD1, POLE, PTEN, RAD50, RAD51C, RAD51D, RNF43, SDHB, SDHC, SDHD, SMAD4, SMARCA4. STK11, TP53, TSC1, TSC2, and VHL.  The following genes were evaluated for sequence changes only: SDHA and HOXB13 c.251G>A variant only. The [facility] Renal/Urinary Tract Cancers Panel analyzes the following 24 genes:BAP1 ,CDC73, CDKN1C, DICER1, DIS3L2, EPCAM, FH, FLCN, GPC3, MET, MLH1, MSH2, MSH6, PMS2, PTEN, SDHB, SDHC, SMARCA4, SMARCB1, TP53, TSC1, TSC2, VHL, WT1. The report date is 05/21/2019. ?  ?05/26/2019 Surgery  ? Left  mastectomy [name]) [phone]): two malignant tumors, First tumor: IDC grade 2, 2.2cm, and IDC grade 3, 0.5cm, clear margins, 8 lymph nodes negative for carcinoma, ER 100%, PR 0%, Ki-67 5%; second tumor: Grade 3 IDC 0.5 cm, margins negative, 0/2 lymph nodes, ER 100%, PR 5%, Ki-67 30%, HER-2 -1+ ?  ?05/26/2019 Oncotype testing  ? The Oncotype DX score was 20 predicting a risk of outside the breast recurrence over the next 9 years of 6 % if the patient's only systemic therapy is tamoxifen for 5 years.  ?  ?06/02/2019 Cancer Staging  ? Staging form: Breast, AJCC 8th Edition ?- Pathologic stage from 06/02/2019: Stage IIA (pT2, pN0, cM0, G2, ER+, PR-, HER2-) - Signed by Nicholas Lose, MD on 06/02/2019 ? ?  ?06/2019 - 06/2024 Anti-estrogen oral therapy  ? Anastrozole 69m/daily  ?  ? ? ?CHIEF COMPLIANT:  Follow-up of left breast cancer on anastrozole ?  ? ?INTERVAL HISTORY: Victoria WESTERGRENis a ? 61y.o. with above-mentioned history of left breast cancer treated with mastectomy and who is currently on antiestrogen therapy with anastrozole.  She presents to the clinic today for follow-up.   ? ?ALLERGIES:  has No Known Allergies. ? ?MEDICATIONS:  ?Current Outpatient Medications  ?Medication Sig Dispense Refill  ? anastrozole (ARIMIDEX) 1 MG tablet TAKE 1 TABLET(1 MG) BY MOUTH DAILY 90 tablet 3  ? Ascorbic Acid (VITAMIN C PO) Take 1 tablet by mouth daily.     ? calcium-vitamin D (OSCAL WITH D) 500-200 MG-UNIT tablet Take 1 tablet by mouth.    ? cholecalciferol (VITAMIN D3) 25 MCG (1000 UNIT) tablet Take 1,000 Units by mouth daily.    ? losartan (COZAAR) 50 MG tablet TAKE 1 TABLET(50 MG) BY MOUTH DAILY 90 tablet 1  ? Multiple Vitamin (MULTIVITAMIN) capsule Take 1  capsule by mouth daily.    ? rosuvastatin (CRESTOR) 20 MG tablet Take 1 tablet (20 mg total) by mouth daily. 90 tablet 3  ? vitamin C (ASCORBIC ACID) 500 MG tablet Take 500 mg by mouth daily.    ? ?No current facility-administered medications for this visit.   ? ? ?PHYSICAL EXAMINATION: ?ECOG PERFORMANCE STATUS: {CHL ONC ECOG PP:[id number]} ? ?There were no vitals filed for this visit. ?There were no vitals filed for this visit. ? ?BREAST:*** No palpable masses or nodules in either right or left breasts. No palpable axillary supraclavicular or infraclavicular adenopathy no breast tenderness or nipple discharge. (exam performed in the presence of a chaperone) ? ?LABORATORY DATA:  ?I have reviewed the data as listed ? ?  Latest Ref Rng & Units 04/20/2021  ?  8:33 AM 04/04/2020  ?  9:01 AM 05/21/2019  ?  2:40 PM  ?CMP  ?Glucose 65 - 99 mg/dL 88   89   122    ?BUN 7 - 25 mg/dL _0 ?Creatinine 0.50 - 1.05 mg/dL 0.55   0.53   0.55    ?Sodium 135 - 146 mmol/L 141   143   139    ?Potassium 3.5 - 5.3 mmol/L 4.3   4.0   3.0    ?Chloride 98 - 110 mmol/L 104   106   101    ?CO2 20 - 32 mmol/L _1 ?Calcium 8.6 - 10.4 mg/dL 9.7   9.5   9.1    ?Total Protein 6.1 - 8.1 g/dL 6.8   6.7     ?Total Bilirubin 0.2 - 1.2 mg/dL 0.6   0.7     ?AST 10 - 35 U/L 13   11     ?ALT 6 - 29 U/L 13   12     ? ? ?Lab Results  ?Component Value Date  ? WBC 9.8 04/20/2021  ? HGB 14.9 04/20/2021  ? HCT 44.9 04/20/2021  ? MCV 98.5 04/20/2021  ? PLT 341 04/20/2021  ? NEUTROABS 9,295 04/20/2021  ? ? ?ASSESSMENT & PLAN:  ?No problem-specific Assessment & Plan notes found for this encounter. ? ? ? ?No orders of the defined types were placed in this encounter. ? ?The patient has a good understanding of the overall plan. she agrees with it. she will call with any problems that may develop before the next visit here. ?Total time spent: 30 mins including face to face time and time spent for planning, charting and co-ordination of care ? ? Suzzette Righter, CMA ?01/03/22 ? ?I Gardiner Coins am scribing for Dr. Lindi Adie ? ?***  ?  ?

## 2022-01-17 ENCOUNTER — Inpatient Hospital Stay: Payer: BC Managed Care – PPO | Admitting: Hematology and Oncology

## 2022-01-29 ENCOUNTER — Other Ambulatory Visit: Payer: Self-pay

## 2022-01-29 ENCOUNTER — Inpatient Hospital Stay: Payer: BC Managed Care – PPO | Attending: Hematology and Oncology | Admitting: Hematology and Oncology

## 2022-01-29 DIAGNOSIS — Z79899 Other long term (current) drug therapy: Secondary | ICD-10-CM | POA: Insufficient documentation

## 2022-01-29 DIAGNOSIS — Z170 Estrogen receptor positive status [ER+]: Secondary | ICD-10-CM

## 2022-01-29 DIAGNOSIS — M2560 Stiffness of unspecified joint, not elsewhere classified: Secondary | ICD-10-CM | POA: Diagnosis not present

## 2022-01-29 DIAGNOSIS — Z9012 Acquired absence of left breast and nipple: Secondary | ICD-10-CM | POA: Insufficient documentation

## 2022-01-29 DIAGNOSIS — Z79811 Long term (current) use of aromatase inhibitors: Secondary | ICD-10-CM | POA: Insufficient documentation

## 2022-01-29 DIAGNOSIS — C50212 Malignant neoplasm of upper-inner quadrant of left female breast: Secondary | ICD-10-CM | POA: Diagnosis not present

## 2022-01-29 MED ORDER — ANASTROZOLE 1 MG PO TABS: ORAL_TABLET | ORAL | 3 refills | Status: DC

## 2022-01-29 NOTE — Assessment & Plan Note (Addendum)
05/26/2019:?Left mastectomy Victoria Richardson): two malignant tumors, First tumor: IDC grade 2, 2.2cm, and IDC grade 3, 0.5cm, clear margins, 8 lymph nodes negative for carcinoma, ER 100%, PR 0%, Ki-67 5%; second tumor: Grade 3 IDC 0.5 cm, margins negative, 0/2 lymph nodes, ER 100%, PR 5%, Ki-67 30%, HER-2 -1+ ?? ?Treatment plan: ?1.?Oncotype DX?score: 20: ROR: 6%  ?2. Adjuvant antiestrogen therapy?with anastrozole 1 mg daily x5 to 7 years?started ?? ?Her mom?Victoria Richardson?is also a patient of mine and breast cancer survivor (in January 2022 she had COVID-19 pneumonia, multiple UTIs: She is getting better now) ?Genetic testing: Negative ?? ?Anastrozole Toxicities:?Occasional hot flashes   ?Joint stiffness especially in the hands: Improved with CBD oil ?Right hip pain: Encouraged her to stretch. ? ?Breast Cancer Surveillance: ?1. Mammograms?05/25/20: Right mammogram, Density Cat C. ?2. Breast Exam: 01/29/2022: Benign, stiffness in the left chest wall area ?? ?Return to clinic in 1 year for follow-up ?

## 2022-01-29 NOTE — Progress Notes (Signed)
? ?Patient Care Team: ?Susy Frizzle, MD as PCP - General (Family Medicine) ?Rolm Bookbinder, MD as Consulting Physician (General Surgery) ?Nicholas Lose, MD as Consulting Physician (Hematology and Oncology) ?Kyung Rudd, MD as Consulting Physician (Radiation Oncology) ? ?DIAGNOSIS:  ?Encounter Diagnosis  ?Name Primary?  ? Malignant neoplasm of upper-inner quadrant of left breast in female, estrogen receptor positive ([place])   ? ? ?SUMMARY OF ONCOLOGIC HISTORY: ?Oncology History  ?Malignant neoplasm of upper-inner quadrant of left breast in female, estrogen receptor positive ([place])  ?05/06/2019 Initial Diagnosis  ? Routine screening mammogram detected an irregular left breast mass at the 9 o'clock position, 2.5cm, with a 0.5cm ill-defined adjacent mass and 1.0cm line of suspicious calcifications inferior and medial to the primary mass. No evidence of left axillary adenopathy. Biopsy showed IDC, grade 2-3, intermediate grade DCIS, HER-2 - by FISH, ER+ 100%, PR -, Ki67 20%. ?  ?05/21/2019 Genetic Testing  ? Negative genetic testing on the [facility] Common Hereditary Cancers Panel + Renal Cancer Panel. The Common Hereditary Cancers Panel offered by [facility] includes sequencing and/or deletion duplication testing of the following 48 genes: APC, ATM, AXIN2, BARD1, BMPR1A, BRCA1, BRCA2, BRIP1, CDH1, CDKN2A (p14ARF), CDKN2A (p16INK4a), CKD4, CHEK2, CTNNA1, DICER1, EPCAM (Deletion/duplication testing only), GREM1 (promoter region deletion/duplication testing only), KIT, MEN1, MLH1, MSH2, MSH3, MSH6, MUTYH, NBN, NF1, NHTL1, PALB2, PDGFRA, PMS2, POLD1, POLE, PTEN, RAD50, RAD51C, RAD51D, RNF43, SDHB, SDHC, SDHD, SMAD4, SMARCA4. STK11, TP53, TSC1, TSC2, and VHL.  The following genes were evaluated for sequence changes only: SDHA and HOXB13 c.251G>A variant only. The [facility] Renal/Urinary Tract Cancers Panel analyzes the following 24 genes:BAP1 ,CDC73, CDKN1C, DICER1, DIS3L2, EPCAM, FH, FLCN, GPC3, MET, MLH1, MSH2, MSH6, PMS2,  PTEN, SDHB, SDHC, SMARCA4, SMARCB1, TP53, TSC1, TSC2, VHL, WT1. The report date is 05/21/2019. ?  ?05/26/2019 Surgery  ? Left mastectomy [name]) [phone]): two malignant tumors, First tumor: IDC grade 2, 2.2cm, and IDC grade 3, 0.5cm, clear margins, 8 lymph nodes negative for carcinoma, ER 100%, PR 0%, Ki-67 5%; second tumor: Grade 3 IDC 0.5 cm, margins negative, 0/2 lymph nodes, ER 100%, PR 5%, Ki-67 30%, HER-2 -1+ ?  ?05/26/2019 Oncotype testing  ? The Oncotype DX score was 20 predicting a risk of outside the breast recurrence over the next 9 years of 6 % if the patient's only systemic therapy is tamoxifen for 5 years.  ?  ?06/02/2019 Cancer Staging  ? Staging form: Breast, AJCC 8th Edition ?- Pathologic stage from 06/02/2019: Stage IIA (pT2, pN0, cM0, G2, ER+, PR-, HER2-) - Signed by Nicholas Lose, MD on 06/02/2019 ? ?  ?06/2019 - 06/2024 Anti-estrogen oral therapy  ? Anastrozole 45m/daily  ?  ? ? ?CHIEF COMPLIANT: Follow-up of left breast cancer on anastrozole ? ?INTERVAL HISTORY: TJAHIRA SWISSis a 61y.o. with above-mentioned history of left breast cancer treated with mastectomy and who is currently on antiestrogen therapy with anastrozole. She presents to the clinic today for a follow-up. She state that her hands are better from the cbc. She state that she have some pain in her leg. Overall she state she is doing good. She complains of stiffness in her left chest wall area ? ? ?ALLERGIES:  has No Known Allergies. ? ?MEDICATIONS:  ?Current Outpatient Medications  ?Medication Sig Dispense Refill  ? anastrozole (ARIMIDEX) 1 MG tablet TAKE 1 TABLET(1 MG) BY MOUTH DAILY 90 tablet 3  ? Ascorbic Acid (VITAMIN C PO) Take 1 tablet by mouth daily.     ? calcium-vitamin D (OSCAL WITH  D) 500-200 MG-UNIT tablet Take 1 tablet by mouth.    ? cholecalciferol (VITAMIN D3) 25 MCG (1000 UNIT) tablet Take 1,000 Units by mouth daily.    ? losartan (COZAAR) 50 MG tablet TAKE 1 TABLET(50 MG) BY MOUTH DAILY 90 tablet 1  ? Multiple  Vitamin (MULTIVITAMIN) capsule Take 1 capsule by mouth daily.    ? rosuvastatin (CRESTOR) 20 MG tablet Take 1 tablet (20 mg total) by mouth daily. 90 tablet 3  ? vitamin C (ASCORBIC ACID) 500 MG tablet Take 500 mg by mouth daily.    ? ?No current facility-administered medications for this visit.  ? ? ?PHYSICAL EXAMINATION: ?ECOG PERFORMANCE STATUS: 1 - Symptomatic but completely ambulatory ? ?Vitals:  ? 01/29/22 1017  ?BP: 136/68  ?Pulse: 88  ?Resp: 18  ?Temp: (!) 97.5 ?F (36.4 ?C)  ?SpO2: 100%  ? ?Filed Weights  ? 01/29/22 1017  ?Weight: 114 lb 8 oz (51.9 kg)  ? ? ?BREAST: No palpable masses or nodules in either right and left chest wall and axilla. No palpable axillary supraclavicular or infraclavicular adenopathy no breast tenderness or nipple discharge. (exam performed in the presence of a chaperone) ? ?LABORATORY DATA:  ?I have reviewed the data as listed ? ?  Latest Ref Rng & Units 04/20/2021  ?  8:33 AM 04/04/2020  ?  9:01 AM 05/21/2019  ?  2:40 PM  ?CMP  ?Glucose 65 - 99 mg/dL 88   89   122    ?BUN 7 - 25 mg/dL '12   10   9    ' ?Creatinine 0.50 - 1.05 mg/dL 0.55   0.53   0.55    ?Sodium 135 - 146 mmol/L 141   143   139    ?Potassium 3.5 - 5.3 mmol/L 4.3   4.0   3.0    ?Chloride 98 - 110 mmol/L 104   106   101    ?CO2 20 - 32 mmol/L '31   31   26    ' ?Calcium 8.6 - 10.4 mg/dL 9.7   9.5   9.1    ?Total Protein 6.1 - 8.1 g/dL 6.8   6.7     ?Total Bilirubin 0.2 - 1.2 mg/dL 0.6   0.7     ?AST 10 - 35 U/L 13   11     ?ALT 6 - 29 U/L 13   12     ? ? ?Lab Results  ?Component Value Date  ? WBC 9.8 04/20/2021  ? HGB 14.9 04/20/2021  ? HCT 44.9 04/20/2021  ? MCV 98.5 04/20/2021  ? PLT 341 04/20/2021  ? NEUTROABS 8,184 04/20/2021  ? ? ?ASSESSMENT & PLAN:  ?Malignant neoplasm of upper-inner quadrant of left breast in female, estrogen receptor positive ([place]) ?05/26/2019: Left mastectomy [name]): two malignant tumors, First tumor: IDC grade 2, 2.2cm, and IDC grade 3, 0.5cm, clear margins, 8 lymph nodes negative for carcinoma, ER  100%, PR 0%, Ki-67 5%; second tumor: Grade 3 IDC 0.5 cm, margins negative, 0/2 lymph nodes, ER 100%, PR 5%, Ki-67 30%, HER-2 -1+ ?  ?Treatment plan: ?1. Oncotype DX score: 20: ROR: 6%  ?2. Adjuvant antiestrogen therapy with anastrozole 1 mg daily x5 to 7 years started ?  ?Her mom Emilio Math is also a patient of mine and breast cancer survivor (in January 2022 she had COVID-19 pneumonia, multiple UTIs: She is getting better now) ?Genetic testing: Negative ?  ?Anastrozole Toxicities: Occasional hot flashes   ?Joint stiffness especially in the hands:  Improved with CBD oil ?Right hip pain: Encouraged her to stretch. ? ?Breast Cancer Surveillance: ?1. Mammograms 05/25/20: Right mammogram, Density Cat C. ?2. Breast Exam: 01/29/2022: Benign, stiffness in the left chest wall area ?  ?Return to clinic in 1 year for follow-up ? ? ?No orders of the defined types were placed in this encounter. ? ?The patient has a good understanding of the overall plan. she agrees with it. she will call with any problems that may develop before the next visit here. ?Total time spent: 30 mins including face to face time and time spent for planning, charting and co-ordination of care ? ? Harriette Ohara, MD ?01/29/22 ? ? ? I Gardiner Coins am scribing for Dr. Lindi Adie ? ?I have reviewed the above documentation for accuracy and completeness, and I agree with the above. ?  ?

## 2022-03-12 ENCOUNTER — Other Ambulatory Visit: Payer: Self-pay | Admitting: Family Medicine

## 2022-04-20 ENCOUNTER — Other Ambulatory Visit: Payer: Self-pay | Admitting: Family Medicine

## 2022-04-20 DIAGNOSIS — I2510 Atherosclerotic heart disease of native coronary artery without angina pectoris: Secondary | ICD-10-CM

## 2022-04-20 NOTE — Telephone Encounter (Signed)
Requested Prescriptions  Pending Prescriptions Disp Refills  . losartan (COZAAR) 50 MG tablet [Pharmacy Med Name: LOSARTAN '50MG'$  TABLETS] 90 tablet 0    Sig: TAKE 1 TABLET(50 MG) BY MOUTH DAILY     Cardiovascular:  Angiotensin Receptor Blockers Failed - 04/20/2022  5:21 PM      Failed - Cr in normal range and within 180 days    Creat  Date Value Ref Range Status  04/20/2021 0.55 0.50 - 1.05 mg/dL Final         Failed - K in normal range and within 180 days    Potassium  Date Value Ref Range Status  04/20/2021 4.3 3.5 - 5.3 mmol/L Final         Failed - Valid encounter within last 6 months    Recent Outpatient Visits          1 year ago Benign essential HTN   [place] Susy Frizzle, MD   2 years ago General medical exam   [place] Susy Frizzle, MD   3 years ago General medical exam   [place] Delsa Grana, PA-C   4 years ago Generalized abdominal pain   [place] Dena Billet B, [place]   4 years ago Sore throat   [place] Orlena Sheldon, [place]             Passed - Patient is not pregnant      Passed - Last BP in normal range    BP Readings from Last 1 Encounters:  01/29/22 136/68

## 2022-05-08 ENCOUNTER — Other Ambulatory Visit: Payer: Self-pay | Admitting: *Deleted

## 2022-05-08 NOTE — Patient Outreach (Signed)
  Care Coordination   05/08/2022  Name: LARETHA LUEPKE MRN: [id number] DOB: 04/07/1961   Care Coordination Outreach Attempts:  An unsuccessful telephone outreach was attempted today to offer the patient information about available care coordination services as a benefit of their health plan. HIPAA compliant message left on voicemail, providing contact information for CSW, encouraging patient to return CSW's call at her earliest convenience.  Follow Up Plan:  Additional outreach attempts will be made to offer the patient care coordination information and services.   Encounter Outcome:  No Answer.   Care Coordination Interventions Activated:  No.    Care Coordination Interventions:  No, not indicated.    Nat Christen, BSW, MSW, LCSW  Licensed Education officer, [facility]  Mailing [place] [address], [place], [place] [address] Physical Address[address], [place], [place] [address] Toll Free Main # [phone] Fax # [phone] Cell # [phone] [name]

## 2022-05-29 ENCOUNTER — Other Ambulatory Visit: Payer: Self-pay | Admitting: *Deleted

## 2022-05-29 NOTE — Patient Outreach (Signed)
  Care Coordination   05/29/2022  Name: Victoria Richardson MRN: [id number] DOB: 1961-06-04   Care Coordination Outreach Attempts:  A second unsuccessful outreach was attempted today to offer the patient with information about available care coordination services as a benefit of their health plan.   HIPAA compliant message left on voicemail, providing contact information for CSW, encouraging patient to return CSW's call at her earliest convenience.  Follow Up Plan:  Additional outreach attempts will be made to offer the patient care coordination information and services.   Encounter Outcome:  No Answer.   Care Coordination Interventions Activated:  No.    Care Coordination Interventions:  No, not indicated.    Nat Christen, BSW, MSW, LCSW  Licensed Education officer, environmental [facility]  Mailing [place] [address], [place], [place] [address] Physical Address[address], [place], [place] [address] Toll Free Main # [phone] Fax # [phone] Cell # [phone] [name]

## 2022-06-04 ENCOUNTER — Other Ambulatory Visit: Payer: Self-pay | Admitting: *Deleted

## 2022-06-04 NOTE — Patient Outreach (Signed)
  Care Coordination   06/04/2022  Name: Victoria Richardson MRN: [id number] DOB: 1960/09/22   Care Coordination Outreach Attempts:  A third unsuccessful outreach was attempted today to offer the patient with information about available care coordination services as a benefit of their health plan. HIPAA compliant message left on voicemail, providing contact information for CSW, encouraging patient to return CSW's call at her earliest convenience.  Follow Up Plan:  No further outreach attempts will be made at this time. We have been unable to contact the patient to offer or enroll patient in care coordination services.  Encounter Outcome:  No Answer.   Care Coordination Interventions Activated:  No.    Care Coordination Interventions:  No, not indicated.    Nat Christen, BSW, MSW, LCSW  Licensed Education officer, environmental Health System  Mailing [place] [address], [place], [place] [address] Physical Address[address], [place], [place] [address] Toll Free Main # [phone] Fax # [phone] Cell # [phone] [name]

## 2022-06-10 ENCOUNTER — Telehealth: Payer: Self-pay | Admitting: Family Medicine

## 2022-06-11 NOTE — Telephone Encounter (Signed)
Requested medications are due for refill today.  yes  Requested medications are on the active medications list.  yes  Last refill. 03/12/2022 #90 0 rf  Future visit scheduled.   no  Notes to clinic.  Pt needs an OV. Labs are expired.    Requested Prescriptions  Pending Prescriptions Disp Refills   rosuvastatin (CRESTOR) 20 MG tablet [Pharmacy Med Name: ROSUVASTATIN '20MG'$  TABLETS] 90 tablet 0    Sig: TAKE 1 TABLET(20 MG) BY MOUTH DAILY     Cardiovascular:  Antilipid - Statins 2 Failed - 06/10/2022  8:23 AM      Failed - Cr in normal range and within 360 days    Creat  Date Value Ref Range Status  04/20/2021 0.55 0.50 - 1.05 mg/dL Final         Failed - Valid encounter within last 12 months    Recent Outpatient Visits           1 year ago Benign essential HTN   [place] Pickard, Cammie Mcgee, MD   2 years ago General medical exam   [place] Susy Frizzle, MD   3 years ago General medical exam   [place] Delsa Grana, PA-C   4 years ago Generalized abdominal pain   [place] Dena Billet B, [place]   4 years ago Sore throat   [place] Dena Billet B, PA-C              Failed - Lipid Panel in normal range within the last 12 months    Cholesterol  Date Value Ref Range Status  04/20/2021 207 (H) <200 mg/dL Final   LDL Cholesterol (Calc)  Date Value Ref Range Status  04/20/2021 121 (H) mg/dL (calc) Final    Comment:    Reference range: <100 . Desirable range <100 mg/dL for primary prevention;   <70 mg/dL for patients with CHD or diabetic patients  with > or = 2 CHD risk factors. Marland Kitchen LDL-C is now calculated using the Martin-Hopkins  calculation, which is a validated novel method providing  better accuracy than the Friedewald equation in the  estimation of LDL-C.  Cresenciano Genre et al. Annamaria Helling. 4403;474(25): 2061-2068  (http://education.[facility].com/faq/FAQ164)    HDL   Date Value Ref Range Status  04/20/2021 65 > OR = 50 mg/dL Final   Triglycerides  Date Value Ref Range Status  04/20/2021 107 <150 mg/dL Final         Passed - Patient is not pregnant

## 2022-06-11 NOTE — Telephone Encounter (Signed)
PT NEEDS PHYSICAL W/PCP FOR FUTURE REFILLS 

## 2022-06-12 NOTE — Telephone Encounter (Signed)
Patient called back; appt scheduled for Monday, 06/18/22.

## 2022-06-12 NOTE — Telephone Encounter (Signed)
Called pt lvm to cb to schedule follow up/appt/cpe to get future refills.

## 2022-06-18 ENCOUNTER — Ambulatory Visit (INDEPENDENT_AMBULATORY_CARE_PROVIDER_SITE_OTHER): Payer: BC Managed Care – PPO | Admitting: Family Medicine

## 2022-06-18 VITALS — BP 116/64 | HR 65 | Temp 98.3°F | Ht 66.0 in | Wt 114.0 lb

## 2022-06-18 DIAGNOSIS — Z122 Encounter for screening for malignant neoplasm of respiratory organs: Secondary | ICD-10-CM

## 2022-06-18 DIAGNOSIS — I10 Essential (primary) hypertension: Secondary | ICD-10-CM | POA: Diagnosis not present

## 2022-06-18 DIAGNOSIS — F17200 Nicotine dependence, unspecified, uncomplicated: Secondary | ICD-10-CM | POA: Diagnosis not present

## 2022-06-18 NOTE — Progress Notes (Signed)
Subjective:    Patient ID: Victoria Richardson, female    DOB: 1960-12-14, 61 y.o.   MRN: [id number]  HPI Patient is a very pleasant 61 year old Caucasian female here today for checkup.  She has a history of smoking.  She has a history of hypertension and hyperlipidemia.  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  She underwent a simple mastectomy.  Thankfully lymph nodes were negative for any malignant spread.  Mammogram was performed in February and was normal.  She is due for a CT scan of her lungs to monitor for lung cancer.  Blood pressure is excellent at 116/64.  Unfortunately she continues to smoke and has no plans to quit.  She is due for a flu shot.  Colonoscopy is up-to-date in 2019.  Mammogram is up-to-date  Past Medical History:  Diagnosis Date   Allergy    Breast cancer in female [facility])    Left   Diverticulitis    Family history of adverse reaction to anesthesia    Mom has post-op n/v   Family history of breast cancer    Family history of colon cancer    Family history of kidney cancer    Family history of prostate cancer    Multinodular goiter    Osteopenia    Past Surgical History:  Procedure Laterality Date   ADENOIDECTOMY     BREAST BIOPSY     BREAST SURGERY N/A    [employer]reesia 04/01/2020   COLONOSCOPY     DILATION AND CURETTAGE OF UTERUS     MASTECTOMY     MASTECTOMY W/ SENTINEL NODE BIOPSY Left 05/26/2019   Procedure: LEFT MASTECTOMY WITH LEFT AXILLARY SENTINEL LYMPH NODE BIOPSY;  Surgeon: Rolm Bookbinder, MD;  Location: [facility];  Service: General;  Laterality: Left;   tubaligation     Current Outpatient Medications on File Prior to Visit  Medication Sig Dispense Refill   anastrozole (ARIMIDEX) 1 MG tablet TAKE 1 TABLET(1 MG) BY MOUTH DAILY 90 tablet 3   Ascorbic Acid (VITAMIN C PO) Take 1 tablet by mouth daily.      calcium-vitamin D (OSCAL WITH D) 500-200 MG-UNIT tablet Take 1 tablet by mouth.     cholecalciferol (VITAMIN D3) 25  MCG (1000 UNIT) tablet Take 1,000 Units by mouth daily.     losartan (COZAAR) 50 MG tablet TAKE 1 TABLET(50 MG) BY MOUTH DAILY 90 tablet 0   Multiple Vitamin (MULTIVITAMIN) capsule Take 1 capsule by mouth daily.     rosuvastatin (CRESTOR) 20 MG tablet TAKE 1 TABLET(20 MG) BY MOUTH DAILY 9030 tablet 0   vitamin C (ASCORBIC ACID) 500 MG tablet Take 500 mg by mouth daily.     No current facility-administered medications on file prior to visit.   No Known Allergies Social History   Socioeconomic History   Marital status: Married    Spouse name: Not on file   Number of children: Not on file   Years of education: Not on file   Highest education level: Not on file  Occupational History   Not on file  Tobacco Use   Smoking status: Every Day    Packs/day: 1.00    Years: 45.00    Total pack years: 45.00    Types: Cigarettes   Smokeless tobacco: Never  Vaping Use   Vaping Use: Never used  Substance and Sexual Activity   Alcohol use: Yes    Alcohol/week: 0.0 standard drinks of alcohol  Comment: rarely   Drug use: No   Sexual activity: Not Currently  Other Topics Concern   Not on file  Social History Narrative   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   Married.    Smokes---   Social Determinants of Health   Financial Resource Strain: Not on file  Food Insecurity: Not on file  Transportation Needs: Not on file  Physical Activity: Not on file  Stress: Not on file  Social Connections: Not on file  Intimate Partner Violence: Not on file   Family History  Problem Relation Age of Onset   Cancer Mother 69       Breast Cancer   Colon polyps Mother    Heart disease Father 37       CAD   Colon cancer Maternal Grandmother        dx 37s   Kidney cancer Maternal Aunt    Prostate cancer Maternal Uncle    Cancer Maternal Uncle        unk type   Breast cancer Cousin        dx early 49s   Hodgkin's lymphoma Cousin    Esophageal cancer Neg Hx     Rectal cancer Neg Hx    Stomach cancer Neg Hx    Pancreatic cancer Neg Hx      Review of Systems  All other systems reviewed and are negative.      Objective:   Physical Exam Vitals reviewed.  Constitutional:      General: She is not in acute distress.    Appearance: Normal appearance. She is not ill-appearing, toxic-appearing or diaphoretic.  HENT:     Head: Normocephalic and atraumatic.     Right Ear: Tympanic membrane, ear canal and external ear normal. There is no impacted cerumen.     Left Ear: Tympanic membrane, ear canal and external ear normal. There is no impacted cerumen.     Nose: Nose normal. No congestion or rhinorrhea.     Mouth/Throat:     Mouth: Mucous membranes are moist.     Pharynx: Oropharynx is clear.  Eyes:     General: No scleral icterus.       Right eye: No discharge.        Left eye: No discharge.     Extraocular Movements: Extraocular movements intact.     Conjunctiva/sclera: Conjunctivae normal.     Pupils: Pupils are equal, round, and reactive to light.  Neck:     Thyroid: Thyromegaly present.     Vascular: No carotid bruit.  Cardiovascular:     Rate and Rhythm: Normal rate and regular rhythm.     Pulses: Normal pulses.     Heart sounds: Normal heart sounds. No murmur heard.    No friction rub. No gallop.  Pulmonary:     Effort: Pulmonary effort is normal. No respiratory distress.     Breath sounds: Normal breath sounds. No stridor. No wheezing, rhonchi or rales.  Chest:     Chest wall: No tenderness.  Abdominal:     General: Bowel sounds are normal. There is no distension.     Palpations: Abdomen is soft.     Tenderness: There is no abdominal tenderness. There is no guarding or rebound.     Hernia: No hernia is present.  Musculoskeletal:     Cervical back: Normal range of motion and neck supple. No rigidity.     Right lower leg: No edema.  Left lower leg: No edema.  Lymphadenopathy:     Cervical: No cervical adenopathy.   Skin:    General: Skin is warm.     Coloration: Skin is not jaundiced or pale.     Findings: No bruising, erythema, lesion or rash.  Neurological:     General: No focal deficit present.     Mental Status: She is alert and oriented to person, place, and time. Mental status is at baseline.     Motor: No weakness.     Coordination: Coordination normal.     Gait: Gait normal.     Deep Tendon Reflexes: Reflexes normal.  Psychiatric:        Mood and Affect: Mood normal.        Behavior: Behavior normal.        Thought Content: Thought content normal.        Judgment: Judgment normal.           Assessment & Plan:  Smoking - Plan: CT CHEST LUNG CA SCREEN LOW DOSE W/O CM  Screening for lung cancer - Plan: CT CHEST LUNG CA SCREEN LOW DOSE W/O CM  Benign essential HTN - Plan: CBC with Differential/Platelet, Lipid panel, COMPLETE METABOLIC PANEL WITH GFR  Strongly recommended smoking cessation.  Patient is in the precontemplative phase.  Blood pressure today is well controlled on losartan.  Patient has a stable goiter.  Check CBC CMP and lipid panel.  Goal LDL cholesterol is less than 100.  Patient received her flu shot.  Schedule a CT scan of the lungs to screen for lung cancer.  Mammogram and colonoscopy are up-to-date.  Recommended a booster for COVID

## 2022-06-19 LAB — COMPLETE METABOLIC PANEL WITH GFR
AG Ratio: 1.8 (calc) (ref 1.0–2.5)
ALT: 16 U/L (ref 6–29)
AST: 14 U/L (ref 10–35)
Albumin: 4.2 g/dL (ref 3.6–5.1)
Alkaline phosphatase (APISO): 60 U/L (ref 37–153)
BUN: 10 mg/dL (ref 7–25)
CO2: 30 mmol/L (ref 20–32)
Calcium: 9.3 mg/dL (ref 8.6–10.4)
Chloride: 104 mmol/L (ref 98–110)
Creat: 0.72 mg/dL (ref 0.50–1.05)
Globulin: 2.4 g/dL (calc) (ref 1.9–3.7)
Glucose, Bld: 94 mg/dL (ref 65–99)
Potassium: 4.2 mmol/L (ref 3.5–5.3)
Sodium: 142 mmol/L (ref 135–146)
Total Bilirubin: 0.4 mg/dL (ref 0.2–1.2)
Total Protein: 6.6 g/dL (ref 6.1–8.1)
eGFR: 94 mL/min/{1.73_m2} (ref >=60)

## 2022-06-19 LAB — LIPID PANEL
Cholesterol: 131 mg/dL (ref <200)
HDL: 54 mg/dL (ref >=50)
LDL Cholesterol (Calc): 53 mg/dL (calc)
Non-HDL Cholesterol (Calc): 77 mg/dL (calc) (ref <130)
Total CHOL/HDL Ratio: 2.4 (calc) (ref <5.0)
Triglycerides: 158 mg/dL — ABNORMAL HIGH (ref <150)

## 2022-06-19 LAB — CBC WITH DIFFERENTIAL/PLATELET
Absolute Monocytes: 626 cells/uL (ref 200–950)
Basophils Absolute: 52 cells/uL (ref 0–200)
Basophils Relative: 0.6 %
Eosinophils Absolute: 574 cells/uL — ABNORMAL HIGH (ref 15–500)
Eosinophils Relative: 6.6 %
HCT: 41 % (ref 35.0–45.0)
Hemoglobin: 14.3 g/dL (ref 11.7–15.5)
Lymphs Abs: 3584 cells/uL (ref 850–3900)
MCH: 33.3 pg — ABNORMAL HIGH (ref 27.0–33.0)
MCHC: 34.9 g/dL (ref 32.0–36.0)
MCV: 95.3 fL (ref 80.0–100.0)
MPV: 10.3 fL (ref 7.5–12.5)
Monocytes Relative: 7.2 %
Neutro Abs: 3863 cells/uL (ref 1500–7800)
Neutrophils Relative %: 44.4 %
Platelets: 311 10*3/uL (ref 140–400)
RBC: 4.3 10*6/uL (ref 3.80–5.10)
RDW: 11.7 % (ref 11.0–15.0)
Total Lymphocyte: 41.2 %
WBC: 8.7 10*3/uL (ref 3.8–10.8)

## 2022-06-25 ENCOUNTER — Other Ambulatory Visit: Payer: Self-pay | Admitting: Hematology and Oncology

## 2022-07-12 ENCOUNTER — Ambulatory Visit
Admission: RE | Admit: 2022-07-12 | Discharge: 2022-07-12 | Disposition: A | Discharge status: Home / Self Care | Payer: BC Managed Care – PPO | Source: Ambulatory Visit | Attending: Family Medicine | Admitting: Family Medicine

## 2022-07-12 DIAGNOSIS — Z122 Encounter for screening for malignant neoplasm of respiratory organs: Secondary | ICD-10-CM

## 2022-07-12 DIAGNOSIS — F17200 Nicotine dependence, unspecified, uncomplicated: Secondary | ICD-10-CM

## 2022-07-12 DIAGNOSIS — F17210 Nicotine dependence, cigarettes, uncomplicated: Secondary | ICD-10-CM | POA: Diagnosis not present

## 2022-07-26 ENCOUNTER — Other Ambulatory Visit: Payer: Self-pay | Admitting: Family Medicine

## 2022-07-26 DIAGNOSIS — I2510 Atherosclerotic heart disease of native coronary artery without angina pectoris: Secondary | ICD-10-CM

## 2022-07-26 NOTE — Telephone Encounter (Signed)
Requested Prescriptions  Pending Prescriptions Disp Refills   losartan (COZAAR) 50 MG tablet [Pharmacy Med Name: LOSARTAN '50MG'$  TABLETS] 90 tablet 1    Sig: TAKE 1 TABLET(50 MG) BY MOUTH DAILY     Cardiovascular:  Angiotensin Receptor Blockers Failed - 07/26/2022 12:09 PM      Failed - Valid encounter within last 6 months    Recent Outpatient Visits           1 year ago Benign essential HTN   [place] Pickard, Cammie Mcgee, MD   2 years ago General medical exam   [place] Susy Frizzle, MD   3 years ago General medical exam   [place] Delsa Grana, PA-C   4 years ago Generalized abdominal pain   [place] Dena Billet B, PA-C   5 years ago Sore throat   [place] Orlena Sheldon, PA-C       Future Appointments             In 2 months Pickard, Cammie Mcgee, MD [place]            Passed - Cr in normal range and within 180 days    Creat  Date Value Ref Range Status  06/18/2022 0.72 0.50 - 1.05 mg/dL Final         Passed - K in normal range and within 180 days    Potassium  Date Value Ref Range Status  06/18/2022 4.2 3.5 - 5.3 mmol/L Final         Passed - Patient is not pregnant      Passed - Last BP in normal range    BP Readings from Last 1 Encounters:  06/18/22 116/64

## 2022-09-24 ENCOUNTER — Ambulatory Visit: Payer: BC Managed Care – PPO | Admitting: Family Medicine

## 2022-11-19 ENCOUNTER — Other Ambulatory Visit: Payer: Self-pay | Admitting: Family Medicine

## 2022-11-19 DIAGNOSIS — Z1231 Encounter for screening mammogram for malignant neoplasm of breast: Secondary | ICD-10-CM

## 2023-01-01 ENCOUNTER — Ambulatory Visit
Admission: RE | Admit: 2023-01-01 | Discharge: 2023-01-01 | Disposition: A | Discharge status: Home / Self Care | Payer: BC Managed Care – PPO | Source: Ambulatory Visit | Attending: Family Medicine | Admitting: Family Medicine

## 2023-01-01 DIAGNOSIS — Z1231 Encounter for screening mammogram for malignant neoplasm of breast: Secondary | ICD-10-CM | POA: Diagnosis not present

## 2023-01-12 ENCOUNTER — Other Ambulatory Visit: Payer: Self-pay | Admitting: Family Medicine

## 2023-01-12 DIAGNOSIS — I2510 Atherosclerotic heart disease of native coronary artery without angina pectoris: Secondary | ICD-10-CM

## 2023-01-14 NOTE — Telephone Encounter (Signed)
Requested Prescriptions  Pending Prescriptions Disp Refills   losartan (COZAAR) 50 MG tablet [Pharmacy Med Name: LOSARTAN 50MG  TABLETS] 90 tablet 0    Sig: TAKE 1 TABLET(50 MG) BY MOUTH DAILY     Cardiovascular:  Angiotensin Receptor Blockers Failed - 01/12/2023  1:25 PM      Failed - Cr in normal range and within 180 days    Creat  Date Value Ref Range Status  06/18/2022 0.72 0.50 - 1.05 mg/dL Final         Failed - K in normal range and within 180 days    Potassium  Date Value Ref Range Status  06/18/2022 4.2 3.5 - 5.3 mmol/L Final         Failed - Valid encounter within last 6 months    Recent Outpatient Visits           1 year ago Benign essential HTN   [facility] Donita Shoff, MD   2 years ago General medical exam   [facility] Donita Garofano, MD   3 years ago General medical exam   [facility] Danelle Berry, PA-C   4 years ago Generalized abdominal pain   [facility] Allayne Butcher B, [place]   5 years ago Sore throat   [facility] Dorena Bodo, [place]              Passed - Patient is not pregnant      Passed - Last BP in normal range    BP Readings from Last 1 Encounters:  06/18/22 116/64

## 2023-01-29 NOTE — Progress Notes (Signed)
Patient Care Team: Donita Laitinen, MD as PCP - General (Family Medicine) Emelia Loron, MD as Consulting Physician (General Surgery) Serena Croissant, MD as Consulting Physician (Hematology and Oncology) Dorothy Puffer, MD as Consulting Physician (Radiation Oncology)  DIAGNOSIS: No diagnosis found.  SUMMARY OF ONCOLOGIC HISTORY: Oncology History  Malignant neoplasm of upper-inner quadrant of left breast in female, estrogen receptor positive (HCC)  05/06/2019 Initial Diagnosis   Routine screening mammogram detected an irregular left breast mass at the 9 o'clock position, 2.5cm, with a 0.5cm ill-defined adjacent mass and 1.0cm line of suspicious calcifications inferior and medial to the primary mass. No evidence of left axillary adenopathy. Biopsy showed IDC, grade 2-3, intermediate grade DCIS, HER-2 - by FISH, ER+ 100%, PR -, Ki67 20%.   05/21/2019 Genetic Testing   Negative genetic testing on the [facility] Common Hereditary Cancers Panel + Renal Cancer Panel. The Common Hereditary Cancers Panel offered by [facility] includes sequencing and/or deletion duplication testing of the following 48 genes: APC, ATM, AXIN2, BARD1, BMPR1A, BRCA1, BRCA2, BRIP1, CDH1, CDKN2A (p14ARF), CDKN2A (p16INK4a), CKD4, CHEK2, CTNNA1, DICER1, EPCAM (Deletion/duplication testing only), GREM1 (promoter region deletion/duplication testing only), KIT, MEN1, MLH1, MSH2, MSH3, MSH6, MUTYH, NBN, NF1, NHTL1, PALB2, PDGFRA, PMS2, POLD1, POLE, PTEN, RAD50, RAD51C, RAD51D, RNF43, SDHB, SDHC, SDHD, SMAD4, SMARCA4. STK11, TP53, TSC1, TSC2, and VHL.  The following genes were evaluated for sequence changes only: SDHA and HOXB13 c.251G>A variant only. The [facility] Renal/Urinary Tract Cancers Panel analyzes the following 24 genes:BAP1 ,CDC73, CDKN1C, DICER1, DIS3L2, EPCAM, FH, FLCN, GPC3, MET, MLH1, MSH2, MSH6, PMS2, PTEN, SDHB, SDHC, SMARCA4, SMARCB1, TP53, TSC1, TSC2, VHL, WT1. The report date is 05/21/2019.   05/26/2019 Surgery   Left  mastectomy Dwain Sarna) [phone]): two malignant tumors, First tumor: IDC grade 2, 2.2cm, and IDC grade 3, 0.5cm, clear margins, 8 lymph nodes negative for carcinoma, ER 100%, PR 0%, Ki-67 5%; second tumor: Grade 3 IDC 0.5 cm, margins negative, 0/2 lymph nodes, ER 100%, PR 5%, Ki-67 30%, HER-2 -1+   05/26/2019 Oncotype testing   The Oncotype DX score was 20 predicting a risk of outside the breast recurrence over the next 9 years of 6 % if the patient's only systemic therapy is tamoxifen for 5 years.    06/02/2019 Cancer Staging   Staging form: Breast, AJCC 8th Edition - Pathologic stage from 06/02/2019: Stage IIA (pT2, pN0, cM0, G2, ER+, PR-, HER2-) - Signed by Serena Croissant, MD on 06/02/2019   06/2019 - 06/2024 Anti-estrogen oral therapy   Anastrozole 1mg /daily      CHIEF COMPLIANT: Follow-up of left breast cancer on anastrozole    INTERVAL HISTORY: Victoria Richardson is a 62 y.o. with above-mentioned history of left breast cancer treated with mastectomy and who is currently on antiestrogen therapy with anastrozole. She presents to the clinic today for a follow-up.    ALLERGIES:  has No Known Allergies.  MEDICATIONS:  Current Outpatient Medications  Medication Sig Dispense Refill   anastrozole (ARIMIDEX) 1 MG tablet TAKE 1 TABLET(1 MG) BY MOUTH DAILY 90 tablet 3   Ascorbic Acid (VITAMIN C PO) Take 1 tablet by mouth daily.      calcium-vitamin D (OSCAL WITH D) 500-200 MG-UNIT tablet Take 1 tablet by mouth.     cholecalciferol (VITAMIN D3) 25 MCG (1000 UNIT) tablet Take 1,000 Units by mouth daily.     losartan (COZAAR) 50 MG tablet TAKE 1 TABLET(50 MG) BY MOUTH DAILY 90 tablet 0   Multiple Vitamin (MULTIVITAMIN) capsule Take 1 capsule by mouth  daily.     rosuvastatin (CRESTOR) 20 MG tablet TAKE 1 TABLET(20 MG) BY MOUTH DAILY 9030 tablet 0   vitamin C (ASCORBIC ACID) 500 MG tablet Take 500 mg by mouth daily.     No current facility-administered medications for this visit.    PHYSICAL  EXAMINATION: ECOG PERFORMANCE STATUS: {CHL ONC ECOG PS:[id number]}  There were no vitals filed for this visit. There were no vitals filed for this visit.  BREAST:*** No palpable masses or nodules in either right or left breasts. No palpable axillary supraclavicular or infraclavicular adenopathy no breast tenderness or nipple discharge. (exam performed in the presence of a chaperone)  LABORATORY DATA:  I have reviewed the data as listed    Latest Ref Rng & Units 06/18/2022    2:28 PM 04/20/2021    8:33 AM 04/04/2020    9:01 AM  CMP  Glucose 65 - 99 mg/dL 94  88  89   BUN 7 - 25 mg/dL 10  12  10    Creatinine 0.50 - 1.05 mg/dL 8.29  5.62  1.30   Sodium 135 - 146 mmol/L 142  141  143   Potassium 3.5 - 5.3 mmol/L 4.2  4.3  4.0   Chloride 98 - 110 mmol/L 104  104  106   CO2 20 - 32 mmol/L 30  31  31    Calcium 8.6 - 10.4 mg/dL 9.3  9.7  9.5   Total Protein 6.1 - 8.1 g/dL 6.6  6.8  6.7   Total Bilirubin 0.2 - 1.2 mg/dL 0.4  0.6  0.7   AST 10 - 35 U/L 14  13  11    ALT 6 - 29 U/L 16  13  12      Lab Results  Component Value Date   WBC 8.7 06/18/2022   HGB 14.3 06/18/2022   HCT 41.0 06/18/2022   MCV 95.3 06/18/2022   PLT 311 06/18/2022   NEUTROABS 3,863 06/18/2022    ASSESSMENT & PLAN:  No problem-specific Assessment & Plan notes found for this encounter.    No orders of the defined types were placed in this encounter.  The patient has a good understanding of the overall plan. she agrees with it. she will call with any problems that may develop before the next visit here. Total time spent: 30 mins including face to face time and time spent for planning, charting and co-ordination of care   Sherlyn Lick, CMA 01/29/23    I Janan Ridge am acting as a Neurosurgeon for [employer]  ***

## 2023-01-31 ENCOUNTER — Other Ambulatory Visit: Payer: Self-pay

## 2023-01-31 ENCOUNTER — Inpatient Hospital Stay: Payer: BC Managed Care – PPO | Attending: Hematology and Oncology | Admitting: Hematology and Oncology

## 2023-01-31 VITALS — BP 123/52 | HR 64 | Temp 97.3°F | Wt 116.5 lb

## 2023-01-31 DIAGNOSIS — Z780 Asymptomatic menopausal state: Secondary | ICD-10-CM | POA: Diagnosis not present

## 2023-01-31 DIAGNOSIS — C50212 Malignant neoplasm of upper-inner quadrant of left female breast: Secondary | ICD-10-CM | POA: Diagnosis not present

## 2023-01-31 DIAGNOSIS — Z79811 Long term (current) use of aromatase inhibitors: Secondary | ICD-10-CM | POA: Diagnosis not present

## 2023-01-31 DIAGNOSIS — Z170 Estrogen receptor positive status [ER+]: Secondary | ICD-10-CM | POA: Insufficient documentation

## 2023-01-31 NOTE — Assessment & Plan Note (Addendum)
05/26/2019: Left mastectomy Dwain Sarna): two malignant tumors, First tumor: IDC grade 2, 2.2cm, and IDC grade 3, 0.5cm, clear margins, 8 lymph nodes negative for carcinoma, ER 100%, PR 0%, Ki-67 5%; second tumor: Grade 3 IDC 0.5 cm, margins negative, 0/2 lymph nodes, ER 100%, PR 5%, Ki-67 30%, HER-2 -1+   Treatment plan: 1. Oncotype DX score: 20: ROR: 6%  2. Adjuvant antiestrogen therapy with anastrozole 1 mg daily x5 to 7 years started Oct 2020   Her mom Carlos Levering is also a patient of mine and breast cancer survivor (in January 2022 she had COVID-19 pneumonia, multiple UTIs: She is getting better now) Genetic testing: Negative   Anastrozole Toxicities: Occasional hot flashes   Joint stiffness especially in the hands: Improved with CBD oil Right hip pain: Encouraged her to stretch.   Breast Cancer Surveillance: 1. Mammograms 01/02/2023: Right mammogram, Density Cat B. 2. Breast Exam: 01/31/2023: Benign, stiffness in the left chest wall area We will order bone density test to be done in the next couple of months. Return to clinic in 1 year for follow-up

## 2023-02-06 ENCOUNTER — Ambulatory Visit (HOSPITAL_BASED_OUTPATIENT_CLINIC_OR_DEPARTMENT_OTHER)
Admission: RE | Admit: 2023-02-06 | Discharge: 2023-02-06 | Disposition: A | Discharge status: Home / Self Care | Payer: BC Managed Care – PPO | Source: Ambulatory Visit | Attending: Hematology and Oncology | Admitting: Hematology and Oncology

## 2023-02-06 DIAGNOSIS — C50212 Malignant neoplasm of upper-inner quadrant of left female breast: Secondary | ICD-10-CM | POA: Insufficient documentation

## 2023-02-06 DIAGNOSIS — Z780 Asymptomatic menopausal state: Secondary | ICD-10-CM | POA: Diagnosis not present

## 2023-02-06 DIAGNOSIS — M810 Age-related osteoporosis without current pathological fracture: Secondary | ICD-10-CM | POA: Diagnosis not present

## 2023-02-06 DIAGNOSIS — Z170 Estrogen receptor positive status [ER+]: Secondary | ICD-10-CM | POA: Diagnosis not present

## 2023-02-07 ENCOUNTER — Telehealth: Payer: Self-pay | Admitting: Hematology and Oncology

## 2023-02-07 ENCOUNTER — Other Ambulatory Visit: Payer: Self-pay | Admitting: Hematology and Oncology

## 2023-02-07 MED ORDER — ALENDRONATE SODIUM 70 MG PO TABS: 70.0000 mg | ORAL_TABLET | ORAL | 3 refills | Status: DC

## 2023-02-07 NOTE — Telephone Encounter (Signed)
Patient's bone density came as osteoporosis with a T-score of -2.8 I recommended bisphosphonate therapy with Fosamax. I counseled her extensively about Fosamax. She has no dental issues.

## 2023-02-12 ENCOUNTER — Telehealth: Payer: Self-pay

## 2023-02-12 NOTE — Telephone Encounter (Signed)
Pt called and LVM in regards to the new medication Fosamax.  Informed her to take it once a week with a full glass of water on an empty stomach. Pt verbalized understanding and was told to call with any concerns or questions.

## 2023-04-17 ENCOUNTER — Other Ambulatory Visit: Payer: Self-pay | Admitting: Family Medicine

## 2023-04-17 DIAGNOSIS — I2510 Atherosclerotic heart disease of native coronary artery without angina pectoris: Secondary | ICD-10-CM

## 2023-04-23 ENCOUNTER — Ambulatory Visit (INDEPENDENT_AMBULATORY_CARE_PROVIDER_SITE_OTHER): Payer: BC Managed Care – PPO | Admitting: Family Medicine

## 2023-04-23 ENCOUNTER — Encounter: Payer: Self-pay | Admitting: Family Medicine

## 2023-04-23 VITALS — BP 120/65 | HR 74 | Temp 98.5°F | Ht 66.0 in | Wt 117.2 lb

## 2023-04-23 DIAGNOSIS — E7800 Pure hypercholesterolemia, unspecified: Secondary | ICD-10-CM | POA: Diagnosis not present

## 2023-04-23 DIAGNOSIS — F17200 Nicotine dependence, unspecified, uncomplicated: Secondary | ICD-10-CM

## 2023-04-23 DIAGNOSIS — I10 Essential (primary) hypertension: Secondary | ICD-10-CM

## 2023-04-23 DIAGNOSIS — I2510 Atherosclerotic heart disease of native coronary artery without angina pectoris: Secondary | ICD-10-CM

## 2023-04-23 LAB — CBC WITH DIFFERENTIAL/PLATELET
Absolute Monocytes: 651 cells/uL (ref 200–950)
Basophils Absolute: 56 cells/uL (ref 0–200)
Basophils Relative: 0.6 %
Eosinophils Absolute: 400 cells/uL (ref 15–500)
Eosinophils Relative: 4.3 %
HCT: 40.8 % (ref 35.0–45.0)
Hemoglobin: 14.2 g/dL (ref 11.7–15.5)
Lymphs Abs: 2102 cells/uL (ref 850–3900)
MCH: 32.9 pg (ref 27.0–33.0)
MCHC: 34.8 g/dL (ref 32.0–36.0)
MCV: 94.7 fL (ref 80.0–100.0)
MPV: 10.4 fL (ref 7.5–12.5)
Monocytes Relative: 7 %
Neutro Abs: 6092 cells/uL (ref 1500–7800)
Neutrophils Relative %: 65.5 %
Platelets: 328 10*3/uL (ref 140–400)
RBC: 4.31 10*6/uL (ref 3.80–5.10)
RDW: 11.8 % (ref 11.0–15.0)
Total Lymphocyte: 22.6 %
WBC: 9.3 10*3/uL (ref 3.8–10.8)

## 2023-04-23 MED ORDER — ROSUVASTATIN CALCIUM 20 MG PO TABS: 20.0000 mg | ORAL_TABLET | Freq: Every day | ORAL | 3 refills | Status: DC

## 2023-04-23 MED ORDER — LOSARTAN POTASSIUM 50 MG PO TABS: ORAL_TABLET | ORAL | 3 refills | Status: DC

## 2023-04-23 NOTE — Progress Notes (Signed)
Subjective:    Patient ID: Victoria Richardson, female    DOB: 13-Aug-1961, 62 y.o.   MRN: [id number]  HPI Patient is a very pleasant 62 year old Caucasian female here today for checkup.  She has a history of smoking.  She has a history of hypertension and hyperlipidemia.  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  She underwent a simple mastectomy.  Thankfully lymph nodes were negative for any malignant spread.  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  LDL in 10/23 was 53.  Currently on rosuvastatin.  Unfortunately, she continues to smoke.  Not on an aspirin.  Denies CP, SOB, DOE.   Past Medical History:  Diagnosis Date   Allergy    Breast cancer in female [facility])    Left   Diverticulitis    Family history of adverse reaction to anesthesia    Mom has post-op n/v   Family history of breast cancer    Family history of colon cancer    Family history of kidney cancer    Family history of prostate cancer    Multinodular goiter    Osteopenia    Past Surgical History:  Procedure Laterality Date   ADENOIDECTOMY     BREAST BIOPSY     BREAST SURGERY N/A    [employer] 04/01/2020   COLONOSCOPY     DILATION AND CURETTAGE OF UTERUS     MASTECTOMY     MASTECTOMY W/ SENTINEL NODE BIOPSY Left 05/26/2019   Procedure: LEFT MASTECTOMY WITH LEFT AXILLARY SENTINEL LYMPH NODE BIOPSY;  Surgeon: Emelia Loron, MD;  Location: [facility] OR;  Service: General;  Laterality: Left;   tubaligation     Current Outpatient Medications on File Prior to Visit  Medication Sig Dispense Refill   alendronate (FOSAMAX) 70 MG tablet Take 1 tablet (70 mg total) by mouth once a week. Take with a full glass of water on an empty stomach. 12 tablet 3   anastrozole (ARIMIDEX) 1 MG tablet TAKE 1 TABLET(1 MG) BY MOUTH DAILY 90 tablet 3   Ascorbic Acid (VITAMIN C PO) Take 1 tablet by mouth daily.      calcium-vitamin D (OSCAL WITH D) 500-200 MG-UNIT tablet  Take 1 tablet by mouth.     cholecalciferol (VITAMIN D3) 25 MCG (1000 UNIT) tablet Take 1,000 Units by mouth daily.     losartan (COZAAR) 50 MG tablet TAKE 1 TABLET(50 MG) BY MOUTH DAILY 90 tablet 0   Multiple Vitamin (MULTIVITAMIN) capsule Take 1 capsule by mouth daily.     rosuvastatin (CRESTOR) 20 MG tablet TAKE 1 TABLET(20 MG) BY MOUTH DAILY 9030 tablet 0   vitamin C (ASCORBIC ACID) 500 MG tablet Take 500 mg by mouth daily.     No current facility-administered medications on file prior to visit.   No Known Allergies Social History   Socioeconomic History   Marital status: Married    Spouse name: Not on file   Number of children: Not on file   Years of education: Not on file   Highest education level: Not on file  Occupational History   Not on file  Tobacco Use   Smoking status: Every Day    Current packs/day: 1.00    Average packs/day: 1 pack/day for 45.0 years (45.0 ttl pk-yrs)    Types: Cigarettes   Smokeless tobacco: Never  Vaping Use   Vaping status: Never Used  Substance and  Sexual Activity   Alcohol use: Yes    Alcohol/week: 0.0 standard drinks of alcohol    Comment: rarely   Drug use: No   Sexual activity: Not Currently  Other Topics Concern   Not on file  Social History Narrative   Works at [employer], cheese. Makes bread etc.   Married.    Smokes---   Social Determinants of Health   Financial Resource Strain: Not on file  Food Insecurity: Not on file  Transportation Needs: Not on file  Physical Activity: Not on file  Stress: Not on file  Social Connections: Not on file  Intimate Partner Violence: Not on file   Family History  Problem Relation Age of Onset   Cancer Mother 46       Breast Cancer   Colon polyps Mother    Heart disease Father 55       CAD   Colon cancer Maternal Grandmother        dx 69s   Kidney cancer Maternal Aunt    Prostate cancer Maternal Uncle    Cancer Maternal Uncle        unk type   Breast  cancer Cousin        dx early 58s   Hodgkin's lymphoma Cousin    Esophageal cancer Neg Hx    Rectal cancer Neg Hx    Stomach cancer Neg Hx    Pancreatic cancer Neg Hx      Review of Systems  All other systems reviewed and are negative.      Objective:   Physical Exam Vitals reviewed.  Constitutional:      General: She is not in acute distress.    Appearance: Normal appearance. She is not ill-appearing, toxic-appearing or diaphoretic.  HENT:     Head: Normocephalic and atraumatic.     Right Ear: Tympanic membrane, ear canal and external ear normal. There is no impacted cerumen.     Left Ear: Tympanic membrane, ear canal and external ear normal. There is no impacted cerumen.     Nose: Nose normal. No congestion or rhinorrhea.     Mouth/Throat:     Mouth: Mucous membranes are moist.     Pharynx: Oropharynx is clear.  Eyes:     General: No scleral icterus.       Right eye: No discharge.        Left eye: No discharge.     Extraocular Movements: Extraocular movements intact.     Conjunctiva/sclera: Conjunctivae normal.     Pupils: Pupils are equal, round, and reactive to light.  Neck:     Vascular: No carotid bruit.  Cardiovascular:     Rate and Rhythm: Normal rate and regular rhythm.     Pulses: Normal pulses.     Heart sounds: Normal heart sounds. No murmur heard.    No friction rub. No gallop.  Pulmonary:     Effort: Pulmonary effort is normal. No respiratory distress.     Breath sounds: Normal breath sounds. No stridor. No wheezing, rhonchi or rales.  Chest:     Chest wall: No tenderness.  Abdominal:     General: Bowel sounds are normal. There is no distension.     Palpations: Abdomen is soft.     Tenderness: There is no abdominal tenderness. There is no guarding or rebound.     Hernia: No hernia is present.  Musculoskeletal:     Cervical back: Normal range of motion and neck supple. No rigidity.  Right lower leg: No edema.     Left lower leg: No edema.   Lymphadenopathy:     Cervical: No cervical adenopathy.  Skin:    General: Skin is warm.     Coloration: Skin is not jaundiced or pale.     Findings: No bruising, erythema, lesion or rash.  Neurological:     General: No focal deficit present.     Mental Status: She is alert and oriented to person, place, and time. Mental status is at baseline.     Motor: No weakness.     Coordination: Coordination normal.     Gait: Gait normal.     Deep Tendon Reflexes: Reflexes normal.  Psychiatric:        Mood and Affect: Mood normal.        Behavior: Behavior normal.        Thought Content: Thought content normal.        Judgment: Judgment normal.           Assessment & Plan:  Smoking - Plan: CBC with Differential/Platelet, COMPLETE METABOLIC PANEL WITH GFR, Lipid panel  Benign essential HTN - Plan: CBC with Differential/Platelet, COMPLETE METABOLIC PANEL WITH GFR, Lipid panel  Pure hypercholesterolemia - Plan: CBC with Differential/Platelet, COMPLETE METABOLIC PANEL WITH GFR, Lipid panel  Coronary artery disease involving native coronary artery of native heart without angina pectoris - Plan: losartan (COZAAR) 50 MG tablet BP is excellent.  Check LDL, goal is less than 55.  Start aspirin 81 mg poqday.  Strongly encouraged smoking cessation.

## 2023-07-02 ENCOUNTER — Telehealth: Payer: Self-pay

## 2023-07-02 ENCOUNTER — Other Ambulatory Visit: Payer: Self-pay

## 2023-07-02 DIAGNOSIS — E7800 Pure hypercholesterolemia, unspecified: Secondary | ICD-10-CM

## 2023-07-02 MED ORDER — ROSUVASTATIN CALCIUM 20 MG PO TABS: 20.0000 mg | ORAL_TABLET | Freq: Every day | ORAL | 1 refills | Status: DC

## 2023-07-02 NOTE — Telephone Encounter (Signed)
Prescription Request  07/02/2023  LOV: 04/23/23  What is the name of the medication or equipment? rosuvastatin (CRESTOR) 20 MG tablet [[id number]]  Have you contacted your pharmacy to request a refill? Yes   Which pharmacy would you like this sent to?  [id number] [place], [place] - [address] AT [facility] [address] & [facility] [address] [place] [address] Phone: [phone] Fax: [phone]    Patient notified that their request is being sent to the clinical staff for review and that they should receive a response within 2 business days.   Please advise at [facility] [phone]

## 2023-10-01 ENCOUNTER — Telehealth: Payer: Self-pay

## 2023-10-01 NOTE — Telephone Encounter (Signed)
 Copied from CRM [id number]. Topic: Clinical - Medication Question >> Oct 01, 2023  9:06 AM Arlina R wrote: Reason for CRM: Patient wants medication that is given for Diverticulitis. Patient said she was prescribed this medication before. Patient does not know the name af medication as well.

## 2023-10-07 ENCOUNTER — Ambulatory Visit (INDEPENDENT_AMBULATORY_CARE_PROVIDER_SITE_OTHER): Payer: BC Managed Care – PPO | Admitting: Family Medicine

## 2023-10-07 ENCOUNTER — Encounter: Payer: Self-pay | Admitting: Family Medicine

## 2023-10-07 VITALS — BP 124/82 | HR 70 | Temp 98.5°F | Ht 66.0 in | Wt 115.8 lb

## 2023-10-07 DIAGNOSIS — F17200 Nicotine dependence, unspecified, uncomplicated: Secondary | ICD-10-CM | POA: Diagnosis not present

## 2023-10-07 DIAGNOSIS — E7800 Pure hypercholesterolemia, unspecified: Secondary | ICD-10-CM

## 2023-10-07 DIAGNOSIS — I2510 Atherosclerotic heart disease of native coronary artery without angina pectoris: Secondary | ICD-10-CM | POA: Diagnosis not present

## 2023-10-07 DIAGNOSIS — I10 Essential (primary) hypertension: Secondary | ICD-10-CM | POA: Diagnosis not present

## 2023-10-07 DIAGNOSIS — Z122 Encounter for screening for malignant neoplasm of respiratory organs: Secondary | ICD-10-CM

## 2023-10-07 DIAGNOSIS — J9801 Acute bronchospasm: Secondary | ICD-10-CM

## 2023-10-07 MED ORDER — PREDNISONE 20 MG PO TABS: ORAL_TABLET | ORAL | 0 refills | Status: DC

## 2023-10-07 MED ORDER — ALBUTEROL SULFATE HFA 108 (90 BASE) MCG/ACT IN AERS: 2.0000 | INHALATION_SPRAY | Freq: Four times a day (QID) | RESPIRATORY_TRACT | 0 refills | Status: DC | PRN

## 2023-10-07 NOTE — Progress Notes (Signed)
Subjective:    Patient ID: Victoria Richardson, female    DOB: 1961-01-15, 63 y.o.   MRN: [id number]  HPI Patient is a very pleasant 63 year old Caucasian female here today for checkup.  She has a history of smoking.  She has a history of hypertension and hyperlipidemia.  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  She underwent a simple mastectomy.  Thankfully lymph nodes were negative for any malignant spread.  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  Patient is wheezing and coughing.  She states that over the weekend, she was dismantling a floor.  She was exposed to black mold.  Since that time she has been wheezing and coughing.  She denies any purulent sputum.  She denies any fever or chills.  She denies any pleurisy.  She is overdue for a CT scan of the lungs to look for lung cancer and she also is due for lab work to check her cholesterol.  Blood pressure today is excellent. Past Medical History:  Diagnosis Date   Allergy    Breast cancer in female [facility])    Left   Diverticulitis    Family history of adverse reaction to anesthesia    Mom has post-op n/v   Family history of breast cancer    Family history of colon cancer    Family history of kidney cancer    Family history of prostate cancer    Multinodular goiter    Osteopenia    Past Surgical History:  Procedure Laterality Date   ADENOIDECTOMY     BREAST BIOPSY     BREAST SURGERY N/A    [employer]ia 04/01/2020   COLONOSCOPY     DILATION AND CURETTAGE OF UTERUS     MASTECTOMY     MASTECTOMY W/ SENTINEL NODE BIOPSY Left 05/26/2019   Procedure: LEFT MASTECTOMY WITH LEFT AXILLARY SENTINEL LYMPH NODE BIOPSY;  Surgeon: Emelia Loron, MD;  Location: [facility] OR;  Service: General;  Laterality: Left;   tubaligation     Current Outpatient Medications on File Prior to Visit  Medication Sig Dispense Refill   alendronate (FOSAMAX) 70 MG tablet Take 1 tablet (70  mg total) by mouth once a week. Take with a full glass of water on an empty stomach. 12 tablet 3   anastrozole (ARIMIDEX) 1 MG tablet TAKE 1 TABLET(1 MG) BY MOUTH DAILY 90 tablet 3   Ascorbic Acid (VITAMIN C PO) Take 1 tablet by mouth daily.      calcium-vitamin D (OSCAL WITH D) 500-200 MG-UNIT tablet Take 1 tablet by mouth.     cholecalciferol (VITAMIN D3) 25 MCG (1000 UNIT) tablet Take 1,000 Units by mouth daily.     losartan (COZAAR) 50 MG tablet TAKE 1 TABLET(50 MG) BY MOUTH DAILY 90 tablet 3   Multiple Vitamin (MULTIVITAMIN) capsule Take 1 capsule by mouth daily.     rosuvastatin (CRESTOR) 20 MG tablet Take 1 tablet (20 mg total) by mouth daily. 90 tablet 1   vitamin C (ASCORBIC ACID) 500 MG tablet Take 500 mg by mouth daily.     No current facility-administered medications on file prior to visit.   No Known Allergies Social History   Socioeconomic History   Marital status: Married    Spouse name: Not on file   Number of children: Not on file   Years of education: Not on file   Highest education level:  Not on file  Occupational History   Not on file  Tobacco Use   Smoking status: Every Day    Current packs/day: 1.00    Average packs/day: 1 pack/day for 45.0 years (45.0 ttl pk-yrs)    Types: Cigarettes   Smokeless tobacco: Never  Vaping Use   Vaping status: Never Used  Substance and Sexual Activity   Alcohol use: Yes    Alcohol/week: 0.0 standard drinks of alcohol    Comment: rarely   Drug use: No   Sexual activity: Not Currently  Other Topics Concern   Not on file  Social History Narrative   Works at [employer], cheese. Makes bread etc.   Married.    Smokes---   Social Drivers of Corporate investment banker Strain: Not on file  Food Insecurity: Not on file  Transportation Needs: Not on file  Physical Activity: Not on file  Stress: Not on file  Social Connections: Not on file  Intimate Partner Violence: Not on file   Family  History  Problem Relation Age of Onset   Cancer Mother 4       Breast Cancer   Colon polyps Mother    Heart disease Father 33       CAD   Colon cancer Maternal Grandmother        dx 8s   Kidney cancer Maternal Aunt    Prostate cancer Maternal Uncle    Cancer Maternal Uncle        unk type   Breast cancer Cousin        dx early 61s   Hodgkin's lymphoma Cousin    Esophageal cancer Neg Hx    Rectal cancer Neg Hx    Stomach cancer Neg Hx    Pancreatic cancer Neg Hx      Review of Systems  All other systems reviewed and are negative.      Objective:   Physical Exam Vitals reviewed.  Constitutional:      General: She is not in acute distress.    Appearance: Normal appearance. She is not ill-appearing, toxic-appearing or diaphoretic.  HENT:     Head: Normocephalic and atraumatic.     Right Ear: Tympanic membrane, ear canal and external ear normal. There is no impacted cerumen.     Left Ear: Tympanic membrane, ear canal and external ear normal. There is no impacted cerumen.     Nose: Nose normal. No congestion or rhinorrhea.     Mouth/Throat:     Mouth: Mucous membranes are moist.     Pharynx: Oropharynx is clear.  Eyes:     General: No scleral icterus.       Right eye: No discharge.        Left eye: No discharge.     Extraocular Movements: Extraocular movements intact.     Conjunctiva/sclera: Conjunctivae normal.     Pupils: Pupils are equal, round, and reactive to light.  Neck:     Vascular: No carotid bruit.  Cardiovascular:     Rate and Rhythm: Normal rate and regular rhythm.     Pulses: Normal pulses.     Heart sounds: Normal heart sounds. No murmur heard.    No friction rub. No gallop.  Pulmonary:     Effort: Pulmonary effort is normal. No respiratory distress.     Breath sounds: No stridor. Wheezing and rhonchi present. No rales.  Chest:     Chest wall: No tenderness.  Abdominal:  General: Bowel sounds are normal. There is no distension.      Palpations: Abdomen is soft.     Tenderness: There is no abdominal tenderness. There is no guarding or rebound.     Hernia: No hernia is present.  Musculoskeletal:     Cervical back: Normal range of motion and neck supple. No rigidity.     Right lower leg: No edema.     Left lower leg: No edema.  Lymphadenopathy:     Cervical: No cervical adenopathy.  Skin:    General: Skin is warm.     Coloration: Skin is not jaundiced or pale.     Findings: No bruising, erythema, lesion or rash.  Neurological:     General: No focal deficit present.     Mental Status: She is alert and oriented to person, place, and time. Mental status is at baseline.     Motor: No weakness.     Coordination: Coordination normal.     Gait: Gait normal.     Deep Tendon Reflexes: Reflexes normal.  Psychiatric:        Mood and Affect: Mood normal.        Behavior: Behavior normal.        Thought Content: Thought content normal.        Judgment: Judgment normal.           Assessment & Plan:  Pure hypercholesterolemia  Smoking - Plan: CT CHEST LUNG CA SCREEN LOW DOSE W/O CM  Coronary artery disease involving native coronary artery of native heart without angina pectoris  Benign essential HTN  Encounter for screening for lung cancer - Plan: CT CHEST LUNG CA SCREEN LOW DOSE W/O CM  Bronchospasm Patient is having bronchospasm and wheezing.  I believe that she is having wheezing due to her exposure to mold.  Will go to try prednisone taper pack coupled with albuterol 2 puffs every 4-6 hours as needed.  Blood pressure today is excellent.  While the patient is here and wanted check fasting lab work.  I would like to see her LDL cholesterol well below 55.  Continue to encourage smoking cessation.  Recommended lung cancer screening with a CT scan.

## 2023-10-08 LAB — COMPLETE METABOLIC PANEL WITH GFR
AG Ratio: 1.7 (calc) (ref 1.0–2.5)
ALT: 15 U/L (ref 6–29)
AST: 15 U/L (ref 10–35)
Albumin: 4.2 g/dL (ref 3.6–5.1)
Alkaline phosphatase (APISO): 64 U/L (ref 37–153)
BUN/Creatinine Ratio: 21 (calc) (ref 6–22)
BUN: 9 mg/dL (ref 7–25)
CO2: 31 mmol/L (ref 20–32)
Calcium: 9.5 mg/dL (ref 8.6–10.4)
Chloride: 107 mmol/L (ref 98–110)
Creat: 0.43 mg/dL — ABNORMAL LOW (ref 0.50–1.05)
Globulin: 2.5 g/dL (ref 1.9–3.7)
Glucose, Bld: 103 mg/dL — ABNORMAL HIGH (ref 65–99)
Potassium: 4.1 mmol/L (ref 3.5–5.3)
Sodium: 146 mmol/L (ref 135–146)
Total Bilirubin: 0.4 mg/dL (ref 0.2–1.2)
Total Protein: 6.7 g/dL (ref 6.1–8.1)
eGFR: 109 mL/min/{1.73_m2} (ref >=60)

## 2023-10-08 LAB — CBC WITH DIFFERENTIAL/PLATELET
Absolute Lymphocytes: 1853 {cells}/uL (ref 850–3900)
Absolute Monocytes: 587 {cells}/uL (ref 200–950)
Basophils Absolute: 43 {cells}/uL (ref 0–200)
Basophils Relative: 0.5 %
Eosinophils Absolute: 459 {cells}/uL (ref 15–500)
Eosinophils Relative: 5.4 %
HCT: 42.1 % (ref 35.0–45.0)
Hemoglobin: 14.4 g/dL (ref 11.7–15.5)
MCH: 31.8 pg (ref 27.0–33.0)
MCHC: 34.2 g/dL (ref 32.0–36.0)
MCV: 92.9 fL (ref 80.0–100.0)
MPV: 10.3 fL (ref 7.5–12.5)
Monocytes Relative: 6.9 %
Neutro Abs: 5559 {cells}/uL (ref 1500–7800)
Neutrophils Relative %: 65.4 %
Platelets: 384 10*3/uL (ref 140–400)
RBC: 4.53 10*6/uL (ref 3.80–5.10)
RDW: 11.6 % (ref 11.0–15.0)
Total Lymphocyte: 21.8 %
WBC: 8.5 10*3/uL (ref 3.8–10.8)

## 2023-10-08 LAB — LIPID PANEL
Cholesterol: 119 mg/dL (ref <200)
HDL: 49 mg/dL — ABNORMAL LOW (ref >=50)
LDL Cholesterol (Calc): 52 mg/dL
Non-HDL Cholesterol (Calc): 70 mg/dL (ref <130)
Total CHOL/HDL Ratio: 2.4 (calc) (ref <5.0)
Triglycerides: 96 mg/dL (ref <150)

## 2023-10-22 ENCOUNTER — Telehealth: Payer: Self-pay

## 2023-10-22 NOTE — Telephone Encounter (Signed)
 Erskin Handing, I received this from CRM regarding Victoria Richardson. Thank you.   Copied from CRM [id number]. Topic: General - Other >> Oct 22, 2023  9:15 AM Powell HERO wrote: Reason for CRM: [facility] needs prior authorization for lung scan by Wednesday the 12th.  [phone] [facility].  Please call to advise when completed

## 2023-10-29 ENCOUNTER — Other Ambulatory Visit: Payer: Self-pay | Admitting: Family Medicine

## 2023-10-31 ENCOUNTER — Ambulatory Visit
Admission: RE | Admit: 2023-10-31 | Discharge: 2023-10-31 | Disposition: A | Discharge status: Home / Self Care | Payer: BC Managed Care – PPO | Source: Ambulatory Visit | Attending: Family Medicine | Admitting: Family Medicine

## 2023-10-31 DIAGNOSIS — Z122 Encounter for screening for malignant neoplasm of respiratory organs: Secondary | ICD-10-CM

## 2023-10-31 DIAGNOSIS — F17200 Nicotine dependence, unspecified, uncomplicated: Secondary | ICD-10-CM

## 2023-10-31 DIAGNOSIS — F17210 Nicotine dependence, cigarettes, uncomplicated: Secondary | ICD-10-CM | POA: Diagnosis not present

## 2024-01-02 ENCOUNTER — Other Ambulatory Visit: Payer: Self-pay | Admitting: Hematology and Oncology

## 2024-02-03 ENCOUNTER — Encounter: Payer: Self-pay | Admitting: Adult Health

## 2024-02-03 ENCOUNTER — Inpatient Hospital Stay: Payer: BC Managed Care – PPO | Attending: Hematology and Oncology | Admitting: Adult Health

## 2024-02-03 VITALS — BP 190/112 | HR 73 | Temp 98.7°F | Resp 18 | Ht 66.0 in | Wt 116.0 lb

## 2024-02-03 DIAGNOSIS — F17200 Nicotine dependence, unspecified, uncomplicated: Secondary | ICD-10-CM | POA: Diagnosis not present

## 2024-02-03 DIAGNOSIS — Z800 Family history of malignant neoplasm of digestive organs: Secondary | ICD-10-CM | POA: Insufficient documentation

## 2024-02-03 DIAGNOSIS — Z1722 Progesterone receptor negative status: Secondary | ICD-10-CM | POA: Insufficient documentation

## 2024-02-03 DIAGNOSIS — Z83719 Family history of colon polyps, unspecified: Secondary | ICD-10-CM | POA: Diagnosis not present

## 2024-02-03 DIAGNOSIS — Z1732 Human epidermal growth factor receptor 2 negative status: Secondary | ICD-10-CM | POA: Insufficient documentation

## 2024-02-03 DIAGNOSIS — Z8042 Family history of malignant neoplasm of prostate: Secondary | ICD-10-CM | POA: Insufficient documentation

## 2024-02-03 DIAGNOSIS — Z807 Family history of other malignant neoplasms of lymphoid, hematopoietic and related tissues: Secondary | ICD-10-CM | POA: Diagnosis not present

## 2024-02-03 DIAGNOSIS — Z79811 Long term (current) use of aromatase inhibitors: Secondary | ICD-10-CM | POA: Insufficient documentation

## 2024-02-03 DIAGNOSIS — M810 Age-related osteoporosis without current pathological fracture: Secondary | ICD-10-CM | POA: Diagnosis not present

## 2024-02-03 DIAGNOSIS — C50212 Malignant neoplasm of upper-inner quadrant of left female breast: Secondary | ICD-10-CM | POA: Diagnosis not present

## 2024-02-03 DIAGNOSIS — Z170 Estrogen receptor positive status [ER+]: Secondary | ICD-10-CM | POA: Insufficient documentation

## 2024-02-03 DIAGNOSIS — Z8051 Family history of malignant neoplasm of kidney: Secondary | ICD-10-CM | POA: Diagnosis not present

## 2024-02-03 DIAGNOSIS — C50412 Malignant neoplasm of upper-outer quadrant of left female breast: Secondary | ICD-10-CM | POA: Diagnosis not present

## 2024-02-03 DIAGNOSIS — F17210 Nicotine dependence, cigarettes, uncomplicated: Secondary | ICD-10-CM | POA: Diagnosis not present

## 2024-02-03 DIAGNOSIS — Z803 Family history of malignant neoplasm of breast: Secondary | ICD-10-CM | POA: Diagnosis not present

## 2024-02-03 DIAGNOSIS — Z9012 Acquired absence of left breast and nipple: Secondary | ICD-10-CM | POA: Insufficient documentation

## 2024-02-03 NOTE — Progress Notes (Signed)
 [facility] Cancer Follow up:    Victoria Lefort, MD [address] [place] [address]   DIAGNOSIS:  Cancer Staging  Malignant neoplasm of upper-inner quadrant of left breast in female, estrogen receptor positive (HCC) Staging form: Breast, AJCC 8th Edition - Clinical stage from 05/13/2019: Stage IIB (cT2, cN0, cM0, G3, ER+, PR-, HER2-) - Unsigned Stage prefix: Initial diagnosis Histologic grading system: 3 grade system Laterality: Left Staged by: Pathologist and managing physician Stage used in treatment planning: Yes National guidelines used in treatment planning: Yes Type of national guideline used in treatment planning: NCCN - Pathologic stage from 06/02/2019: Stage IIA (pT2, pN0, cM0, G2, ER+, PR-, HER2-) - Signed by Cameron Cea, MD on 06/02/2019 Histologic grading system: 3 grade system    SUMMARY OF ONCOLOGIC HISTORY: Oncology History  Malignant neoplasm of upper-inner quadrant of left breast in female, estrogen receptor positive (HCC)  05/06/2019 Initial Diagnosis   Routine screening mammogram detected an irregular left breast mass at the 9 o'clock position, 2.5cm, with a 0.5cm ill-defined adjacent mass and 1.0cm line of suspicious calcifications inferior and medial to the primary mass. No evidence of left axillary adenopathy. Biopsy showed IDC, grade 2-3, intermediate grade DCIS, HER-2 - by FISH, ER+ 100%, PR -, Ki67 20%.   05/21/2019 Genetic Testing   Negative genetic testing on the [facility] Common Hereditary Cancers Panel + Renal Cancer Panel. The Common Hereditary Cancers Panel offered by [facility] includes sequencing and/or deletion duplication testing of the following 48 genes: APC, ATM, AXIN2, BARD1, BMPR1A, BRCA1, BRCA2, BRIP1, CDH1, CDKN2A (p14ARF), CDKN2A (p16INK4a), CKD4, CHEK2, CTNNA1, DICER1, EPCAM (Deletion/duplication testing only), GREM1 (promoter region deletion/duplication testing only), KIT, MEN1, MLH1, MSH2, MSH3, MSH6, MUTYH, NBN, NF1,  NHTL1, PALB2, PDGFRA, PMS2, POLD1, POLE, PTEN, RAD50, RAD51C, RAD51D, RNF43, SDHB, SDHC, SDHD, SMAD4, SMARCA4. STK11, TP53, TSC1, TSC2, and VHL.  The following genes were evaluated for sequence changes only: SDHA and HOXB13 c.251G>A variant only. The [facility] Renal/Urinary Tract Cancers Panel analyzes the following 24 genes:BAP1 ,CDC73, CDKN1C, DICER1, DIS3L2, EPCAM, FH, FLCN, GPC3, MET, MLH1, MSH2, MSH6, PMS2, PTEN, SDHB, SDHC, SMARCA4, SMARCB1, TP53, TSC1, TSC2, VHL, WT1. The report date is 05/21/2019.   05/26/2019 Surgery   Left mastectomy Victoria Richardson) [phone]): two malignant tumors, First tumor: IDC grade 2, 2.2cm, and IDC grade 3, 0.5cm, clear margins, 8 lymph nodes negative for carcinoma, ER 100%, PR 0%, Ki-67 5%; second tumor: Grade 3 IDC 0.5 cm, margins negative, 0/2 lymph nodes, ER 100%, PR 5%, Ki-67 30%, HER-2 -1+   05/26/2019 Oncotype testing   The Oncotype DX score was 20 predicting a risk of outside the breast recurrence over the next 9 years of 6 % if the patient's only systemic therapy is tamoxifen for 5 years.    06/02/2019 Cancer Staging   Staging form: Breast, AJCC 8th Edition - Pathologic stage from 06/02/2019: Stage IIA (pT2, pN0, cM0, G2, ER+, PR-, HER2-) - Signed by Cameron Cea, MD on 06/02/2019   06/2019 - 06/2024 Anti-estrogen oral therapy   Anastrozole  1mg /daily      CURRENT THERAPY: Anastrozole  x 7 years  INTERVAL HISTORY:  Victoria Richardson 63 y.o. female returns for follow-up of her history of left-sided breast cancer status postmastectomy.  She continues on anastrozole  daily.  She has some difficulty with arthralgias related to taking the anastrozole , however plans to take anastrozole  x 7 years.  She continues to smoke cigarettes daily and is receptive to receive info on our quit classes.  Her most recent right breast mammogram occurred on January 01, 2023 demonstrating no mammographic evidence of malignancy and breast density category B.  Her most recent bone density  testing occurred Feb 06, 2023 demonstrating osteoporosis with a T-score -2.8 in the left forearm.  She is taking Fosamax  weekly.  She also underwent lung cancer screening on October 31, 2023 that demonstrated lung RADS 2, benign appearance or behavior.  Continued low-dose lung cancer screening was recommended again in February 2025.   Patient Active Problem List   Diagnosis Date Noted   Osteopenia    Multinodular goiter    Breast cancer, left breast (HCC) 05/26/2019   Genetic testing 05/22/2019   Family history of breast cancer    Family history of colon cancer    Family history of prostate cancer    Family history of kidney cancer    Malignant neoplasm of upper-inner quadrant of left breast in female, estrogen receptor positive (HCC) 05/06/2019   Family history of breast cancer in mother 04/02/2019   Weight loss 04/02/2019   Abnormal TSH 04/02/2019   Diverticulitis 04/02/2019   Aortic atherosclerosis (HCC) 04/02/2019   Smoker 01/30/2016   Encounter for smoking cessation counseling 01/30/2016   HYPERLIPIDEMIA 12/15/2007   CERVICAL POLYP 12/15/2007   RECTAL BLEEDING, HX OF 12/15/2007    has no known allergies.  MEDICAL HISTORY: Past Medical History:  Diagnosis Date   Allergy    Breast cancer in female [facility])    Left   Diverticulitis    Family history of adverse reaction to anesthesia    Mom has post-op n/v   Family history of breast cancer    Family history of colon cancer    Family history of kidney cancer    Family history of prostate cancer    Multinodular goiter    Osteopenia     SURGICAL HISTORY: Past Surgical History:  Procedure Laterality Date   ADENOIDECTOMY     BREAST BIOPSY     BREAST SURGERY N/A    Phreesia 04/01/2020   COLONOSCOPY     DILATION AND CURETTAGE OF UTERUS     MASTECTOMY     MASTECTOMY W/ SENTINEL NODE BIOPSY Left 05/26/2019   Procedure: LEFT MASTECTOMY WITH LEFT AXILLARY SENTINEL LYMPH NODE BIOPSY;  Surgeon: Enid Harry, MD;   Location: [facility] OR;  Service: General;  Laterality: Left;   tubaligation      SOCIAL HISTORY: Social History   Socioeconomic History   Marital status: Married    Spouse name: Not on file   Number of children: Not on file   Years of education: Not on file   Highest education level: Not on file  Occupational History   Not on file  Tobacco Use   Smoking status: Every Day    Current packs/day: 1.00    Average packs/day: 1 pack/day for 45.0 years (45.0 ttl pk-yrs)    Types: Cigarettes   Smokeless tobacco: Never  Vaping Use   Vaping status: Never Used  Substance and Sexual Activity   Alcohol use: Yes    Alcohol/week: 0.0 standard drinks of alcohol    Comment: rarely   Drug use: No   Sexual activity: Not Currently  Other Topics Concern   Not on file  Social History Narrative   Works at [employer], cheese. Makes bread etc.   Married.    Smokes---   Social Drivers of Corporate investment banker Strain: Not on file  Food Insecurity: Not on file  Transportation  Needs: Not on file  Physical Activity: Not on file  Stress: Not on file  Social Connections: Not on file  Intimate Partner Violence: Not on file    FAMILY HISTORY: Family History  Problem Relation Age of Onset   Cancer Mother 85       Breast Cancer   Colon polyps Mother    Heart disease Father 87       CAD   Colon cancer Maternal Grandmother        dx 72s   Kidney cancer Maternal Aunt    Prostate cancer Maternal Uncle    Cancer Maternal Uncle        unk type   Breast cancer Cousin        dx early 31s   Hodgkin's lymphoma Cousin    Esophageal cancer Neg Hx    Rectal cancer Neg Hx    Stomach cancer Neg Hx    Pancreatic cancer Neg Hx     Review of Systems  Constitutional:  Negative for appetite change, chills, fatigue, fever and unexpected weight change.  HENT:   Negative for hearing loss, lump/mass and trouble swallowing.   Eyes:  Negative for eye problems and icterus.   Respiratory:  Negative for chest tightness, cough and shortness of breath.   Cardiovascular:  Negative for chest pain, leg swelling and palpitations.  Gastrointestinal:  Negative for abdominal distention, abdominal pain, constipation, diarrhea, nausea and vomiting.  Endocrine: Negative for hot flashes.  Genitourinary:  Negative for difficulty urinating.   Musculoskeletal:  Negative for arthralgias.  Skin:  Negative for itching and rash.  Neurological:  Negative for dizziness, extremity weakness, headaches and numbness.  Hematological:  Negative for adenopathy. Does not bruise/bleed easily.  Psychiatric/Behavioral:  Negative for depression. The patient is not nervous/anxious.       PHYSICAL EXAMINATION    Vitals:   02/03/24 1029  BP: (!) 190/112  Pulse: 73  Resp: 18  Temp: 98.7 F (37.1 C)  SpO2: 98%    Physical Exam Constitutional:      General: She is not in acute distress.    Appearance: Normal appearance. She is not toxic-appearing.  HENT:     Head: Normocephalic and atraumatic.     Mouth/Throat:     Mouth: Mucous membranes are moist.     Pharynx: Oropharynx is clear. No oropharyngeal exudate or posterior oropharyngeal erythema.  Eyes:     General: No scleral icterus. Cardiovascular:     Rate and Rhythm: Normal rate and regular rhythm.     Pulses: Normal pulses.     Heart sounds: Normal heart sounds.  Pulmonary:     Effort: Pulmonary effort is normal.     Breath sounds: Normal breath sounds.  Chest:     Comments: Right breast is benign, left breast status postmastectomy no sign of local recurrence Abdominal:     General: Abdomen is flat. Bowel sounds are normal. There is no distension.     Palpations: Abdomen is soft.     Tenderness: There is no abdominal tenderness.  Musculoskeletal:        General: No swelling.     Cervical back: Neck supple.  Lymphadenopathy:     Cervical: No cervical adenopathy.     Upper Body:     Right upper body: No supraclavicular  or axillary adenopathy.     Left upper body: No supraclavicular or axillary adenopathy.  Skin:    General: Skin is warm and dry.     Findings: No  rash.  Neurological:     General: No focal deficit present.     Mental Status: She is alert.  Psychiatric:        Mood and Affect: Mood normal.        Behavior: Behavior normal.      ASSESSMENT and THERAPY PLAN:   Malignant neoplasm of upper-inner quadrant of left breast in female, estrogen receptor positive (HCC) 05/26/2019: Left mastectomy Victoria Richardson): two malignant tumors, First tumor: IDC grade 2, 2.2cm, and IDC grade 3, 0.5cm, clear margins, 8 lymph nodes negative for carcinoma, ER 100%, PR 0%, Ki-67 5%; second tumor: Grade 3 IDC 0.5 cm, margins negative, 0/2 lymph nodes, ER 100%, PR 5%, Ki-67 30%, HER-2 -1+   Treatment plan: 1. Oncotype DX score: 20: ROR: 6%  2. Adjuvant antiestrogen therapy with anastrozole  1 mg daily x5 to 7 years started Oct 2020   Her mom Victoria Richardson is also a patient [facility] and breast cancer survivor (in January 2022 she had COVID-19 pneumonia, multiple UTIs: She is getting better now) Genetic testing: Negative   Anastrozole  Toxicities: Occasional hot flashes     Breast Cancer Surveillance: 1. Mammograms 01/02/2023: Right mammogram, Density Cat B. 2. Breast Exam: 02/03/2024: benign  -No clinical signs of breast cancer recurrence.   -Continue on Anastrozole  with goal of 7 years  Osteoporosis.  Taking Fosamax  weekly with good tolerance.  Taking Calcium  and Vitamin D .   -Continue fosamax  for osteoporosis -Repeat bone density testing in 01/2025 -Reviewed bone health handout  Current every day cigarette smoker.  Reviewed smoking can also lead to bone loss.   -Gave handout on [facility] on demand quitting classes.    RTC in 1 year for f/u or sooner if needed.   All questions were answered. The patient knows to call the clinic with any problems, questions or concerns. We can certainly see the patient much  sooner if necessary.  Total encounter time:30 minutes*in face-to-face visit time, chart review, lab review, care coordination, order entry, and documentation of the encounter time.    Alwin Baars, NP 02/03/24 11:22 AM Medical Oncology and Hematology [facility] [address] [place], [place] [address] Tel. [phone]    Fax. [phone]  *Total Encounter Time as defined by the Centers for Medicare and Medicaid Services includes, in addition to the face-to-face time of a patient visit (documented in the note above) non-face-to-face time: obtaining and reviewing outside history, ordering and reviewing medications, tests or procedures, care coordination (communications with other health care professionals or caregivers) and documentation in the medical record.

## 2024-02-03 NOTE — Assessment & Plan Note (Signed)
 05/26/2019: Left mastectomy Delane Fear): two malignant tumors, First tumor: IDC grade 2, 2.2cm, and IDC grade 3, 0.5cm, clear margins, 8 lymph nodes negative for carcinoma, ER 100%, PR 0%, Ki-67 5%; second tumor: Grade 3 IDC 0.5 cm, margins negative, 0/2 lymph nodes, ER 100%, PR 5%, Ki-67 30%, HER-2 -1+   Treatment plan: 1. Oncotype DX score: 20: ROR: 6%  2. Adjuvant antiestrogen therapy with anastrozole  1 mg daily x5 to 7 years started Oct 2020   Her mom Regino Caprio is also a patient [facility] and breast cancer survivor (in January 2022 she had COVID-19 pneumonia, multiple UTIs: She is getting better now) Genetic testing: Negative   Anastrozole  Toxicities: Occasional hot flashes     Breast Cancer Surveillance: 1. Mammograms 01/02/2023: Right mammogram, Density Cat B. 2. Breast Exam: 02/03/2024: benign  -No clinical signs of breast cancer recurrence.   -Continue on Anastrozole  with goal of 7 years  Osteoporosis.  Taking Fosamax  weekly with good tolerance.  Taking Calcium  and Vitamin D .   -Continue fosamax  for osteoporosis -Repeat bone density testing in 01/2025 -Reviewed bone health handout  Current every day cigarette smoker.  Reviewed smoking can also lead to bone loss.   -Gave handout on [facility] on demand quitting classes.    RTC in 1 year for f/u or sooner if needed.

## 2024-02-03 NOTE — Patient Instructions (Addendum)
 Dear Victoria Richardson,   Congratulations for your interest in quitting smoking!  Find a program that suits you best: when you want to quit, how you need support, where you live, and how you like to learn.    If you're ready to get started TODAY, consider scheduling a visit through [email or web address].  Appointments are available from 8am to 8pm, Monday to Friday.   Most health insurance plans will cover some level of tobacco cessation visits and medications.    Additional Resources: [employer] are also available to help you quit & provide the support you'll need. Many programs are available in both [place] and Spanish and have a long history of successfully helping people get off and stay off tobacco.    Quit Smoking Apps:  quitSTART at SeriousBroker.de QuitGuide?at ForgetParking.dk Online education and resources: Smokefree  at [employer].gov Free Telephone Coaching: QuitNow,  Call 1-800-QUIT-NOW (570-135-8000) or Text- Ready to 819-475-6477 *Quitline [place] has teamed up with Medicaid to offer a free 14 week program    Vaping- Want to Quit? Free 24/7 support. Call [facility]  [place], [place], [place], [place], [place]    Bone Health Bones protect organs, store calcium , anchor muscles, and support the whole body. Keeping your bones strong is important, especially as you get older. You can take actions to help keep your bones strong and healthy. Why is keeping my bones healthy important?  Keeping your bones healthy is important because your body constantly replaces bone cells. Cells get old, and new cells take their place. As we age, we lose bone cells because the body may not be able to make enough new cells to replace the old cells. The amount of bone cells and bone tissue you have is referred to as bone mass. The higher your bone mass, the stronger your bones. The aging process leads to an overall loss  of bone mass in the body, which can increase the likelihood of: Broken bones. A condition in which the bones become weak and brittle (osteoporosis). A large decline in bone mass occurs in older adults. In women, it occurs about the time of menopause. What actions can I take to keep my bones healthy? Good health habits are important for maintaining healthy bones. This includes eating nutritious foods and exercising regularly. To have healthy bones, you need to get enough of the right minerals and vitamins. Most nutrition experts recommend getting these nutrients from the foods that you eat. In some cases, taking supplements may also be recommended. Doing certain types of exercise is also important for bone health. What are the nutritional recommendations for healthy bones?  Eating a well-balanced diet with plenty of calcium  and vitamin D  will help to protect your bones. Nutritional recommendations vary from person to person. Ask your health care provider what is healthy for you. Here are some general guidelines. Get enough calcium  Calcium  is the most important (essential) mineral for bone health. Most people can get enough calcium  from their diet, but supplements may be recommended for people who are at risk for osteoporosis. Good sources of calcium  include: Dairy products, such as low-fat or nonfat milk, cheese, and yogurt. Dark green leafy vegetables, such as bok choy and broccoli. Foods that have calcium  added to them (are fortified). Foods that may be fortified with calcium  include orange juice, cereal, bread, soy beverages, and tofu products. Nuts, such as almonds. Follow these recommended amounts for daily calcium  intake: Infants, 0-6 months: 200 mg. Infants, 6-12 months: 260 mg.  Children, age 32-3: 700 mg. Children, age 51-8: 1,000 mg. Children, age 21-13: 1,300 mg. Teens, age 327-18: 1,300 mg. Adults, age 29-50: 1,000 mg. Adults, age 62-70: Men: 1,000 mg. Women: 1,200 mg. Adults, age 26  or older: 1,200 mg. Pregnant and breastfeeding females: Teens: 1,300 mg. Adults: 1,000 mg. Get enough vitamin D  Vitamin D  is the most essential vitamin for bone health. It helps the body absorb calcium . Sunlight stimulates the skin to make vitamin D , so be sure to get enough sunlight. If you live in a cold climate or you do not get outside often, your health care provider may recommend that you take vitamin D  supplements. Good sources of vitamin D  in your diet include: Egg yolks. Saltwater fish. Milk and cereal fortified with vitamin D . Follow these recommended amounts for daily vitamin D  intake: Infants, 0-12 months: 400 international units (IU). Children and teens, age 32-18: 600 international units. Adults, age 50 or younger: 600 international units. Adults, age 70 or older: 600-1,000 international units. Get other important nutrients Other nutrients that are important for bone health include: Phosphorus. This mineral is found in meat, poultry, dairy foods, nuts, and legumes. The recommended daily intake for adult men and adult women is 700 mg. Magnesium. This mineral is found in seeds, nuts, dark green vegetables, and legumes. The recommended daily intake for adult men is 400-420 mg. For adult women, it is 310-320 mg. Vitamin K. This vitamin is found in green leafy vegetables. The recommended daily intake is 120 mcg for adult men and 90 mcg for adult women. What type of physical activity is best for building and maintaining healthy bones? Weight-bearing and strength-building activities are important for building and maintaining healthy bones. Weight-bearing activities cause muscles and bones to work against gravity. Strength-building activities increase the strength of the muscles that support bones. Weight-bearing and muscle-building activities include: Walking and hiking. Jogging and running. Dancing. Gym exercises. Lifting weights. Tennis and racquetball. Climbing  stairs. Aerobics. Adults should get at least 30 minutes of moderate physical activity on most days. Children should get at least 60 minutes of moderate physical activity on most days. Ask your health care provider what type of exercise is best for you. How can I find out if my bone mass is low? Bone mass can be measured with an X-ray test called a bone mineral density (BMD) test. This test is recommended for all women who are age 51 or older. It may also be recommended for: Men who are age 36 or older. People who are at risk for osteoporosis because of: Having a long-term disease that weakens bones, such as kidney disease or rheumatoid arthritis. Having menopause earlier than normal. Taking medicine that weakens bones, such as steroids, thyroid hormones, or hormone treatment for breast cancer or prostate cancer. Smoking. Drinking three or more alcoholic drinks a day. Being underweight. Sedentary lifestyle. If you find that you have a low bone mass, you may be able to prevent osteoporosis or further bone loss by changing your diet and lifestyle. Where can I find more information? Bone Health & Osteoporosis Foundation: https://carlson-fletcher.info/ [employer]: www.bones.http://www.myers.net/ International Osteoporosis Foundation: Investment banker, operational.iofbonehealth.org Summary The aging process leads to an overall loss of bone mass in the body, which can increase the likelihood of broken bones and osteoporosis. Eating a well-balanced diet with plenty of calcium  and vitamin D  will help to protect your bones. Weight-bearing and strength-building activities are also important for building and maintaining strong bones. Bone mass can be measured with  an X-ray test called a bone mineral density (BMD) test. This information is not intended to replace advice given to you by your health care provider. Make sure you discuss any questions you have with your health care provider. Document Revised: 02/15/2021 Document Reviewed:  02/15/2021 Elsevier Patient Education  2024 [employer].

## 2024-03-12 ENCOUNTER — Ambulatory Visit
Admission: RE | Admit: 2024-03-12 | Discharge: 2024-03-12 | Disposition: A | Discharge status: Home / Self Care | Source: Ambulatory Visit | Attending: Adult Health | Admitting: Adult Health

## 2024-03-12 DIAGNOSIS — C50212 Malignant neoplasm of upper-inner quadrant of left female breast: Secondary | ICD-10-CM

## 2024-03-30 ENCOUNTER — Other Ambulatory Visit: Payer: Self-pay | Admitting: Hematology and Oncology

## 2024-03-30 ENCOUNTER — Other Ambulatory Visit: Payer: Self-pay | Admitting: Family Medicine

## 2024-03-30 DIAGNOSIS — I2510 Atherosclerotic heart disease of native coronary artery without angina pectoris: Secondary | ICD-10-CM

## 2024-06-09 ENCOUNTER — Other Ambulatory Visit: Payer: Self-pay | Admitting: Adult Health

## 2024-06-09 DIAGNOSIS — C50212 Malignant neoplasm of upper-inner quadrant of left female breast: Secondary | ICD-10-CM

## 2024-06-09 DIAGNOSIS — Z79811 Long term (current) use of aromatase inhibitors: Secondary | ICD-10-CM

## 2024-07-01 ENCOUNTER — Other Ambulatory Visit: Payer: Self-pay | Admitting: Family Medicine

## 2024-07-01 DIAGNOSIS — E7800 Pure hypercholesterolemia, unspecified: Secondary | ICD-10-CM

## 2024-09-22 ENCOUNTER — Telehealth: Payer: Self-pay

## 2024-09-22 NOTE — Telephone Encounter (Signed)
 Returned pt call concerning new onset of pain in breast that began 09/19/24. Pt describes the pain as aching from her L breast where she previously had a mastectomy. She reports that she had cording previously post mastectomy but that this feels different. Pt denies any redness, swelling. She describes the pan as intermittent and worsening with movement. Reports taking ibuprofen with no relief. Appt scheduled for 1/12 w/ Linsey NP. Pt verbalized understanding.

## 2024-09-28 ENCOUNTER — Inpatient Hospital Stay: Attending: Adult Health | Admitting: Adult Health

## 2024-09-28 ENCOUNTER — Encounter: Payer: Self-pay | Admitting: Adult Health

## 2024-09-28 ENCOUNTER — Other Ambulatory Visit: Payer: Self-pay | Admitting: Family Medicine

## 2024-09-28 ENCOUNTER — Inpatient Hospital Stay

## 2024-09-28 VITALS — BP 124/49 | HR 67 | Temp 98.3°F | Resp 14 | Ht 66.0 in | Wt 117.4 lb

## 2024-09-28 DIAGNOSIS — Z79811 Long term (current) use of aromatase inhibitors: Secondary | ICD-10-CM

## 2024-09-28 DIAGNOSIS — R0789 Other chest pain: Secondary | ICD-10-CM

## 2024-09-28 DIAGNOSIS — Z1231 Encounter for screening mammogram for malignant neoplasm of breast: Secondary | ICD-10-CM

## 2024-09-28 DIAGNOSIS — Z170 Estrogen receptor positive status [ER+]: Secondary | ICD-10-CM

## 2024-09-28 DIAGNOSIS — M816 Localized osteoporosis [Lequesne]: Secondary | ICD-10-CM

## 2024-09-28 DIAGNOSIS — C50212 Malignant neoplasm of upper-inner quadrant of left female breast: Secondary | ICD-10-CM | POA: Diagnosis not present

## 2024-09-28 DIAGNOSIS — E7800 Pure hypercholesterolemia, unspecified: Secondary | ICD-10-CM

## 2024-09-28 LAB — CMP (CANCER CENTER ONLY)
ALT: 18 U/L (ref 0–44)
AST: 20 U/L (ref 15–41)
Albumin: 4.4 g/dL (ref 3.5–5.0)
Alkaline Phosphatase: 63 U/L (ref 38–126)
Anion gap: 10 (ref 5–15)
BUN: 9 mg/dL (ref 8–23)
CO2: 28 mmol/L (ref 22–32)
Calcium: 9.6 mg/dL (ref 8.9–10.3)
Chloride: 104 mmol/L (ref 98–111)
Creatinine: 0.49 mg/dL (ref 0.44–1.00)
GFR, Estimated: >60 mL/min
Glucose, Bld: 110 mg/dL — ABNORMAL HIGH (ref 70–99)
Potassium: 3.9 mmol/L (ref 3.5–5.1)
Sodium: 142 mmol/L (ref 135–145)
Total Bilirubin: 0.5 mg/dL (ref 0.0–1.2)
Total Protein: 7.4 g/dL (ref 6.5–8.1)

## 2024-09-28 LAB — CBC WITH DIFFERENTIAL (CANCER CENTER ONLY)
Abs Immature Granulocytes: 0.04 K/uL (ref 0.00–0.07)
Basophils Absolute: 0.1 K/uL (ref 0.0–0.1)
Basophils Relative: 1 %
Eosinophils Absolute: 0.3 K/uL (ref 0.0–0.5)
Eosinophils Relative: 3 %
HCT: 41 % (ref 36.0–46.0)
Hemoglobin: 14.4 g/dL (ref 12.0–15.0)
Immature Granulocytes: 0 %
Lymphocytes Relative: 22 %
Lymphs Abs: 2.2 K/uL (ref 0.7–4.0)
MCH: 32.9 pg (ref 26.0–34.0)
MCHC: 35.1 g/dL (ref 30.0–36.0)
MCV: 93.6 fL (ref 80.0–100.0)
Monocytes Absolute: 0.6 K/uL (ref 0.1–1.0)
Monocytes Relative: 6 %
Neutro Abs: 6.5 K/uL (ref 1.7–7.7)
Neutrophils Relative %: 68 %
Platelet Count: 319 K/uL (ref 150–400)
RBC: 4.38 MIL/uL (ref 3.87–5.11)
RDW: 11.7 % (ref 11.5–15.5)
WBC Count: 9.7 K/uL (ref 4.0–10.5)
nRBC: 0 % (ref 0.0–0.2)

## 2024-09-28 MED ORDER — TRAMADOL HCL 50 MG PO TABS: 50.0000 mg | ORAL_TABLET | Freq: Two times a day (BID) | ORAL | 0 refills | Status: AC | PRN

## 2024-09-28 NOTE — Progress Notes (Signed)
 [facility] Cancer Follow up:    Duanne Butler DASEN, MD [address] [place] [address]   DIAGNOSIS:  Cancer Staging  Malignant neoplasm of upper-inner quadrant of left breast in female, estrogen receptor positive (HCC) Staging form: Breast, AJCC 8th Edition - Clinical stage from 05/13/2019: Stage IIB (cT2, cN0, cM0, G3, ER+, PR-, HER2-) - Unsigned Stage prefix: Initial diagnosis Histologic grading system: 3 grade system Laterality: Left Staged by: Pathologist and managing physician Stage used in treatment planning: Yes National guidelines used in treatment planning: Yes Type of national guideline used in treatment planning: NCCN - Pathologic stage from 06/02/2019: Stage IIA (pT2, pN0, cM0, G2, ER+, PR-, HER2-) - Signed by Odean Potts, MD on 06/02/2019 Histologic grading system: 3 grade system    SUMMARY OF ONCOLOGIC HISTORY: Oncology History  Malignant neoplasm of upper-inner quadrant of left breast in female, estrogen receptor positive (HCC)  05/06/2019 Initial Diagnosis   Routine screening mammogram detected an irregular left breast mass at the 9 o'clock position, 2.5cm, with a 0.5cm ill-defined adjacent mass and 1.0cm line of suspicious calcifications inferior and medial to the primary mass. No evidence of left axillary adenopathy. Biopsy showed IDC, grade 2-3, intermediate grade DCIS, HER-2 - by FISH, ER+ 100%, PR -, Ki67 20%.   05/21/2019 Genetic Testing   Negative genetic testing on the [facility] Common Hereditary Cancers Panel + Renal Cancer Panel. The Common Hereditary Cancers Panel offered by [facility] includes sequencing and/or deletion duplication testing of the following 48 genes: APC, ATM, AXIN2, BARD1, BMPR1A, BRCA1, BRCA2, BRIP1, CDH1, CDKN2A (p14ARF), CDKN2A (p16INK4a), CKD4, CHEK2, CTNNA1, DICER1, EPCAM (Deletion/duplication testing only), GREM1 (promoter region deletion/duplication testing only), KIT, MEN1, MLH1, MSH2, MSH3, MSH6, MUTYH, NBN, NF1,  NHTL1, PALB2, PDGFRA, PMS2, POLD1, POLE, PTEN, RAD50, RAD51C, RAD51D, RNF43, SDHB, SDHC, SDHD, SMAD4, SMARCA4. STK11, TP53, TSC1, TSC2, and VHL.  The following genes were evaluated for sequence changes only: SDHA and HOXB13 c.251G>A variant only. The [facility] Renal/Urinary Tract Cancers Panel analyzes the following 24 genes:BAP1 ,CDC73, CDKN1C, DICER1, DIS3L2, EPCAM, FH, FLCN, GPC3, MET, MLH1, MSH2, MSH6, PMS2, PTEN, SDHB, SDHC, SMARCA4, SMARCB1, TP53, TSC1, TSC2, VHL, WT1. The report date is 05/21/2019.   05/26/2019 Surgery   Left mastectomy Viktoria) [phone]): two malignant tumors, First tumor: IDC grade 2, 2.2cm, and IDC grade 3, 0.5cm, clear margins, 8 lymph nodes negative for carcinoma, ER 100%, PR 0%, Ki-67 5%; second tumor: Grade 3 IDC 0.5 cm, margins negative, 0/2 lymph nodes, ER 100%, PR 5%, Ki-67 30%, HER-2 -1+   05/26/2019 Oncotype testing   The Oncotype DX score was 20 predicting a risk of outside the breast recurrence over the next 9 years of 6 % if the patient's only systemic therapy is tamoxifen for 5 years.    06/02/2019 Cancer Staging   Staging form: Breast, AJCC 8th Edition - Pathologic stage from 06/02/2019: Stage IIA (pT2, pN0, cM0, G2, ER+, PR-, HER2-) - Signed by Odean Potts, MD on 06/02/2019   06/2019 -  Anti-estrogen oral therapy   Anastrozole  1mg /daily      CURRENT THERAPY:  INTERVAL HISTORY:  Discussed the use of AI scribe software for clinical note transcription with the patient, who gave verbal consent to proceed.  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  She describes severe left chest  wall pain radiating to the mid-back for several weeks, with associated swelling and dyspnea due to pain. She denies trauma or heavy lifting. Over-the-counter analgesics,  including Advil, have not helped, and the pain limits daily activities. She is worried about worsening bone symptoms on the left side.  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 Annual right breast screening mammograms continue, with the most recent in June 2025 showing no malignancy and breast density category B. She has a 37 pack-year smoking history and underwent lung cancer screening in February 2025 that was lung-rads 2.   She has osteoporosis with a T-score of -2.8 in the left forearm on May 2024 bone density testing and is on weekly Fosamax , with repeat bone density testing due. She is concerned her bone pain, particularly on the left side, is worsening.     Patient Active Problem List   Diagnosis Date Noted   Osteoporosis    Multinodular goiter    Genetic testing 05/22/2019   Family history of breast cancer    Family history of colon cancer    Family history of prostate cancer    Family history of kidney cancer    Malignant neoplasm of upper-inner quadrant of left breast in female, estrogen receptor positive (HCC) 05/06/2019   Family history of breast cancer in mother 04/02/2019   Weight loss 04/02/2019   Abnormal TSH 04/02/2019   Diverticulitis 04/02/2019   Aortic atherosclerosis 04/02/2019   Smoker 01/30/2016   Encounter for smoking cessation counseling 01/30/2016   HYPERLIPIDEMIA 12/15/2007   CERVICAL POLYP 12/15/2007   RECTAL BLEEDING, HX OF 12/15/2007    has no known allergies.  MEDICAL HISTORY: Past Medical History:  Diagnosis Date   Allergy    Breast cancer in female [facility])    Left   Diverticulitis    Family history of adverse reaction to anesthesia    Mom has post-op n/v   Family history of breast cancer    Family history of colon cancer    Family history of kidney cancer    Family history of prostate cancer     Multinodular goiter    Osteopenia     SURGICAL HISTORY: Past Surgical History:  Procedure Laterality Date   ADENOIDECTOMY     BREAST BIOPSY     BREAST SURGERY N/A    Phreesia 04/01/2020   COLONOSCOPY     DILATION AND CURETTAGE OF UTERUS     MASTECTOMY     MASTECTOMY W/ SENTINEL NODE BIOPSY Left 05/26/2019   Procedure: LEFT MASTECTOMY WITH LEFT AXILLARY SENTINEL LYMPH NODE BIOPSY;  Surgeon: Ebbie Cough, MD;  Location: [facility] OR;  Service: General;  Laterality: Left;   tubaligation      SOCIAL HISTORY: Social History   Socioeconomic History   Marital status: Married    Spouse name: Not on file   Number of children: Not on file   Years of education: Not on file   Highest education level: Not on file  Occupational History   Not on file  Tobacco Use   Smoking status: Former    Current packs/day: 1.00    Average packs/day: 1 pack/day for 45.0 years (45.0 ttl pk-yrs)    Types: Cigarettes   Smokeless tobacco: Never  Vaping Use   Vaping status: Never Used  Substance and Sexual Activity   Alcohol use: Yes    Alcohol/week: 0.0 standard drinks of  alcohol    Comment: rarely   Drug use: No   Sexual activity: Not Currently  Other Topics Concern   Not on file  Social History Narrative   Works at [employer], cheese. Makes bread etc.   Married.    Smokes---   Social Drivers of Health   Tobacco Use: Medium Risk (09/28/2024)   Patient History    Smoking Tobacco Use: Former    Smokeless Tobacco Use: Never    Passive Exposure: Not on file  Financial Resource Strain: Low Risk (09/28/2024)   Overall Financial Resource Strain (CARDIA)    Difficulty of Paying Living Expenses: Not hard at all  Food Insecurity: No Food Insecurity (09/28/2024)   Epic    Worried About Programme Researcher, Broadcasting/film/video in the Last Year: Never true    Ran Out of Food in the Last Year: Never true  Transportation Needs: No Transportation Needs (09/28/2024)   Epic    Lack of Transportation  (Medical): No    Lack of Transportation (Non-Medical): No  Physical Activity: Inactive (09/28/2024)   Exercise Vital Sign    Days of Exercise per Week: 0 days    Minutes of Exercise per Session: 0 min  Stress: No Stress Concern Present (09/28/2024)   [employer] of Occupational Health - Occupational Stress Questionnaire    Feeling of Stress: Not at all  Social Connections: Moderately Isolated (09/28/2024)   Social Connection and Isolation Panel    Frequency of Communication with Friends and Family: More than three times a week    Frequency of Social Gatherings with Friends and Family: More than three times a week    Attends Religious Services: Never    Database Administrator or Organizations: No    Attends Banker Meetings: Never    Marital Status: Married  Catering Manager Violence: Not At Risk (09/28/2024)   Epic    Fear of Current or Ex-Partner: No    Emotionally Abused: No    Physically Abused: No    Sexually Abused: No  Depression (PHQ2-9): Low Risk (09/28/2024)   Depression (PHQ2-9)    PHQ-2 Score: 0  Alcohol Screen: Low Risk (09/28/2024)   Alcohol Screen    Last Alcohol Screening Score (AUDIT): 1  Housing: Unknown (09/28/2024)   Epic    Unable to Pay for Housing in the Last Year: No    Number of Times Moved in the Last Year: Not on file    Homeless in the Last Year: No  Utilities: Not At Risk (09/28/2024)   Epic    Threatened with loss of utilities: No  Health Literacy: Adequate Health Literacy (09/28/2024)   B1300 Health Literacy    Frequency of need for help with medical instructions: Never    FAMILY HISTORY: Family History  Problem Relation Age of Onset   Cancer Mother 10       Breast Cancer   Colon polyps Mother    Heart disease Father 1       CAD   Colon cancer Maternal Grandmother        dx 29s   Kidney cancer Maternal Aunt    Prostate cancer Maternal Uncle    Cancer Maternal Uncle        unk type   Breast cancer Cousin        dx early  19s   Hodgkin's lymphoma Cousin    Esophageal cancer Neg Hx    Rectal cancer Neg Hx    Stomach cancer  Neg Hx    Pancreatic cancer Neg Hx     Review of Systems  Constitutional:  Negative for appetite change, chills, fatigue, fever and unexpected weight change.  HENT:   Negative for hearing loss, lump/mass and trouble swallowing.   Eyes:  Negative for eye problems and icterus.  Respiratory:  Negative for chest tightness, cough and shortness of breath.   Cardiovascular:  Negative for chest pain, leg swelling and palpitations.  Gastrointestinal:  Negative for abdominal distention, abdominal pain, constipation, diarrhea, nausea and vomiting.  Endocrine: Negative for hot flashes.  Genitourinary:  Negative for difficulty urinating.   Musculoskeletal:  Negative for arthralgias.  Skin:  Negative for itching and rash.  Neurological:  Negative for dizziness, extremity weakness, headaches and numbness.  Hematological:  Negative for adenopathy. Does not bruise/bleed easily.  Psychiatric/Behavioral:  Negative for depression. The patient is not nervous/anxious.       PHYSICAL EXAMINATION   Onc Performance Status - 09/28/24 1052       ECOG Perf Status   ECOG Perf Status Restricted in physically strenuous activity but ambulatory and able to carry out work of a light or sedentary nature, e.g., light house work, office work      KPS SCALE   KPS % SCORE Able to carry on normal activity, minor s/s of disease          Vitals:   09/28/24 1051  BP: (!) 124/49  Pulse: 67  Resp: 14  Temp: 98.3 F (36.8 C)  SpO2: 98%    Physical Exam Constitutional:      General: She is not in acute distress.    Appearance: Normal appearance. She is not toxic-appearing.  HENT:     Head: Normocephalic and atraumatic.     Mouth/Throat:     Mouth: Mucous membranes are moist.     Pharynx: Oropharynx is clear. No oropharyngeal exudate or posterior oropharyngeal erythema.  Eyes:     General: No scleral  icterus. Cardiovascular:     Rate and Rhythm: Normal rate and regular rhythm.     Pulses: Normal pulses.     Heart sounds: Normal heart sounds.  Pulmonary:     Effort: Pulmonary effort is normal.     Breath sounds: Normal breath sounds.  Chest:     Comments: Right breast benign; left breast s/p mastectomy, no sign of local recurrence noted Abdominal:     General: Abdomen is flat. Bowel sounds are normal. There is no distension.     Palpations: Abdomen is soft.     Tenderness: There is no abdominal tenderness.  Musculoskeletal:        General: No swelling.     Cervical back: Neck supple.  Lymphadenopathy:     Cervical: No cervical adenopathy.     Upper Body:     Right upper body: No supraclavicular or axillary adenopathy.     Left upper body: No supraclavicular or axillary adenopathy.  Skin:    General: Skin is warm and dry.     Findings: No rash.  Neurological:     General: No focal deficit present.     Mental Status: She is alert.  Psychiatric:        Mood and Affect: Mood normal.        Behavior: Behavior normal.     LABORATORY DATA:  CBC    Component Value Date/Time   WBC 9.7 09/28/2024 1122   WBC 8.5 10/07/2023 0829   RBC 4.38 09/28/2024 1122   HGB 14.4  09/28/2024 1122   HCT 41.0 09/28/2024 1122   PLT 319 09/28/2024 1122   MCV 93.6 09/28/2024 1122   MCH 32.9 09/28/2024 1122   MCHC 35.1 09/28/2024 1122   RDW 11.7 09/28/2024 1122   LYMPHSABS 2.2 09/28/2024 1122   MONOABS 0.6 09/28/2024 1122   EOSABS 0.3 09/28/2024 1122   BASOSABS 0.1 09/28/2024 1122    CMP     Component Value Date/Time   NA 142 09/28/2024 1122   K 3.9 09/28/2024 1122   CL 104 09/28/2024 1122   CO2 28 09/28/2024 1122   GLUCOSE 110 (H) 09/28/2024 1122   BUN 9 09/28/2024 1122   CREATININE 0.49 09/28/2024 1122   CREATININE 0.43 (L) 10/07/2023 0829   CALCIUM  9.6 09/28/2024 1122   PROT 7.4 09/28/2024 1122   ALBUMIN 4.4 09/28/2024 1122   AST 20 09/28/2024 1122   ALT 18 09/28/2024 1122    ALKPHOS 63 09/28/2024 1122   BILITOT 0.5 09/28/2024 1122   GFRNONAA >60 09/28/2024 1122   GFRNONAA 103 04/04/2020 0901   GFRAA 120 04/04/2020 0901     ASSESSMENT and THERAPY PLAN:    Assessment and Plan Assessment & Plan Chest wall pain New left chest wall pain radiating to mid-back with swelling and tenderness. Further evaluation required to exclude malignancy or other serious causes. - Ordered CT chest with IV contrast to evaluate for recurrence, musculoskeletal, or other etiologies. - Ordered laboratory tests to assess renal function prior to CT. - Prescribed tramadol  for pain.  Reviewed goals of pain management, reviewed risks and benefits in detail.  Discussed constipation and recommended Miralax PRN. - Provided anticipatory guidance regarding potential flushing with IV contrast and emphasized importance of hydration. - Scheduled follow-up in 2-3 weeks to review imaging, with option for virtual or in-person visit.  Stage IA ER-positive invasive ductal carcinoma of the left breast, status post mastectomy, on anastrozole  On adjuvant anastrozole  with planned completion in 2027. No evidence of recurrence on exam. Most recent right breast screening mammogram negative for malignancy. - Continue anastrozole  as prescribed. - Continue annual right breast screening mammograms.  Localized osteoporosis of the left forearm, on Fosamax  Osteoporosis of the left forearm on weekly alendronate . Reports concern for worsening bone pain. Bone density testing due; scheduling delayed by local facility backlogs. - Ordered repeat bone density testing at [facility] location per her preference fro 01/2025 when due.  - Continue weekly alendronate . - Instructed to contact office if bone density testing is not scheduled within a few days.   RTC in 2-3 weeks after CT chest to review results and discuss next steps.     All questions were answered. The patient knows to call the clinic with any problems,  questions or concerns. We can certainly see the patient much sooner if necessary.  Total encounter time:30 minutes*in face-to-face visit time, chart review, lab review, care coordination, order entry, and documentation of the encounter time.    Morna Kendall, NP 09/28/2024 2:09 PM Medical Oncology and Hematology [facility] [address] [place], [place] [address] Tel. [phone]    Fax. [phone]  *Total Encounter Time as defined by the Centers for Medicare and Medicaid Services includes, in addition to the face-to-face time of a patient visit (documented in the note above) non-face-to-face time: obtaining and reviewing outside history, ordering and reviewing medications, tests or procedures, care coordination (communications with other health care professionals or caregivers) and documentation in the medical record.

## 2024-10-12 ENCOUNTER — Inpatient Hospital Stay: Admitting: Adult Health

## 2024-10-15 ENCOUNTER — Ambulatory Visit (HOSPITAL_BASED_OUTPATIENT_CLINIC_OR_DEPARTMENT_OTHER)
Admission: RE | Admit: 2024-10-15 | Discharge: 2024-10-15 | Disposition: A | Discharge status: Home / Self Care | Source: Ambulatory Visit | Attending: Adult Health | Admitting: Adult Health

## 2024-10-15 DIAGNOSIS — R0789 Other chest pain: Secondary | ICD-10-CM | POA: Diagnosis present

## 2024-10-15 DIAGNOSIS — C50212 Malignant neoplasm of upper-inner quadrant of left female breast: Secondary | ICD-10-CM | POA: Diagnosis present

## 2024-10-15 DIAGNOSIS — Z170 Estrogen receptor positive status [ER+]: Secondary | ICD-10-CM | POA: Insufficient documentation

## 2024-10-15 MED ORDER — IOHEXOL 300 MG/ML  SOLN
75.0000 mL | Freq: Once | INTRAMUSCULAR | Status: AC | PRN
  Administered 2024-10-15: 75 mL via INTRAVENOUS

## 2024-10-22 ENCOUNTER — Inpatient Hospital Stay: Admitting: Hematology and Oncology

## 2024-10-22 DIAGNOSIS — C50212 Malignant neoplasm of upper-inner quadrant of left female breast: Secondary | ICD-10-CM

## 2024-10-22 NOTE — Progress Notes (Signed)
 HEMATOLOGY-ONCOLOGY TELEPHONE VISIT PROGRESS NOTE  I connected with our patient on 10/22/24 at  9:30 AM EST by telephone and verified that I am speaking with the correct person using two identifiers.  I discussed the limitations, risks, security and privacy concerns of performing an evaluation and management service by telephone and the availability of in person appointments.  I also discussed with the patient that there may be a patient responsible charge related to this service. The patient expressed understanding and agreed to proceed.   History of Present Illness: Telephone follow-up to discuss results of CT scans  History of Present Illness Victoria Richardson is a 64 year old female with ER-positive, HER2-negative invasive ductal carcinoma of the left breast, status post mastectomy and adjuvant anastrozole  therapy, in remission, who presents for evaluation of chronic postmastectomy pain.  She remains in remission following left mastectomy in September 2020 and anastrozole  since October 2020. Recent chest imaging shows no recurrence. She has stable thyroid nodules, mild emphysema, and small lung nodules. She is on routine surveillance and denies new oncologic symptoms.  She has intermittent left chest wall pain at the mastectomy site radiating to the mid-back. She describes a pinching quality. She denies new or progressive symptoms concerning for recurrence.  She otherwise feels well aside from mild upper respiratory symptoms. Recent CBC and CMP were normal.      Oncology History  Malignant neoplasm of upper-inner quadrant of left breast in female, estrogen receptor positive (HCC)  05/06/2019 Initial Diagnosis   Routine screening mammogram detected an irregular left breast mass at the 9 o'clock position, 2.5cm, with a 0.5cm ill-defined adjacent mass and 1.0cm line of suspicious calcifications inferior and medial to the primary mass. No evidence of left axillary adenopathy. Biopsy showed IDC,  grade 2-3, intermediate grade DCIS, HER-2 - by FISH, ER+ 100%, PR -, Ki67 20%.   05/21/2019 Genetic Testing   Negative genetic testing on the [facility] Common Hereditary Cancers Panel + Renal Cancer Panel. The Common Hereditary Cancers Panel offered by [facility] includes sequencing and/or deletion duplication testing of the following 48 genes: APC, ATM, AXIN2, BARD1, BMPR1A, BRCA1, BRCA2, BRIP1, CDH1, CDKN2A (p14ARF), CDKN2A (p16INK4a), CKD4, CHEK2, CTNNA1, DICER1, EPCAM (Deletion/duplication testing only), GREM1 (promoter region deletion/duplication testing only), KIT, MEN1, MLH1, MSH2, MSH3, MSH6, MUTYH, NBN, NF1, NHTL1, PALB2, PDGFRA, PMS2, POLD1, POLE, PTEN, RAD50, RAD51C, RAD51D, RNF43, SDHB, SDHC, SDHD, SMAD4, SMARCA4. STK11, TP53, TSC1, TSC2, and VHL.  The following genes were evaluated for sequence changes only: SDHA and HOXB13 c.251G>A variant only. The [facility] Renal/Urinary Tract Cancers Panel analyzes the following 24 genes:BAP1 ,CDC73, CDKN1C, DICER1, DIS3L2, EPCAM, FH, FLCN, GPC3, MET, MLH1, MSH2, MSH6, PMS2, PTEN, SDHB, SDHC, SMARCA4, SMARCB1, TP53, TSC1, TSC2, VHL, WT1. The report date is 05/21/2019.   05/26/2019 Surgery   Left mastectomy Viktoria) [id number]): two malignant tumors, First tumor: IDC grade 2, 2.2cm, and IDC grade 3, 0.5cm, clear margins, 8 lymph nodes negative for carcinoma, ER 100%, PR 0%, Ki-67 5%; second tumor: Grade 3 IDC 0.5 cm, margins negative, 0/2 lymph nodes, ER 100%, PR 5%, Ki-67 30%, HER-2 -1+   05/26/2019 Oncotype testing   The Oncotype DX score was 20 predicting a risk of outside the breast recurrence over the next 9 years of 6 % if the patient's only systemic therapy is tamoxifen for 5 years.    06/02/2019 Cancer Staging   Staging form: Breast, AJCC 8th Edition - Pathologic stage from 06/02/2019: Stage IIA (pT2, pN0, cM0, G2, ER+, PR-, HER2-) - Signed by  Cannan Beeck, MD on 06/02/2019   06/2019 -  Anti-estrogen oral therapy   Anastrozole  1mg /daily      REVIEW OF  SYSTEMS:   Constitutional: Denies fevers, chills or abnormal weight loss All other systems were reviewed with the patient and are negative. Observations/Objective:     Assessment Plan:  Malignant neoplasm of upper-inner quadrant of left breast in female, estrogen receptor positive (HCC) 05/26/2019: Left mastectomy Viktoria): two malignant tumors, First tumor: IDC grade 2, 2.2cm, and IDC grade 3, 0.5cm, clear margins, 8 lymph nodes negative for carcinoma, ER 100%, PR 0%, Ki-67 5%; second tumor: Grade 3 IDC 0.5 cm, margins negative, 0/2 lymph nodes, ER 100%, PR 5%, Ki-67 30%, HER-2 -1+   Treatment plan: 1. Oncotype DX score: 20: ROR: 6%  2. Adjuvant antiestrogen therapy with anastrozole  1 mg daily x5 to 7 years started Oct 2020   Her mom Laray Cumming is also a patient of mine and breast cancer survivor  Genetic testing: Negative   Anastrozole  Toxicities: Occasional hot flashes   Joint stiffness especially in the hands: Improved with CBD oil Osteoporosis: On Fosamax    Breast Cancer Surveillance: 1. Mammograms 01/02/2023: Right mammogram, Density Cat B. 2. Breast Exam: 01/31/2023: Benign, stiffness in the left chest wall area  Chest wall pain: CT chest 10/15/2024: No metastatic disease identified in the chest.  Thyroid nodules, stable lung nodules, emphysema Return to clinic in May at her normal follow-up time --------------------------------- Assessment and Plan Assessment & Plan Estrogen receptor positive breast cancer in remission Remains in remission post-mastectomy. No disease evidence on imaging. Favorable prognosis. - Continued anastrozole  1 mg daily. - Reviewed imaging and blood work; no recurrence. - Next follow-up May 2026.  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 - Symptomatic management with topical diclofenac, ibuprofen, or acetaminophen  as needed.  Pulmonary emphysema Mild emphysema, stable  on imaging.  Stable pulmonary nodules Small nodules stable, not suspicious for malignancy.  Stable thyroid nodule Thyroid nodules stable, unchanged on imaging.      I discussed the assessment and treatment plan with the patient. The patient was provided an opportunity to ask questions and all were answered. The patient agreed with the plan and demonstrated an understanding of the instructions. The patient was advised to call back or seek an in-person evaluation if the symptoms worsen or if the condition fails to improve as anticipated.   I provided 20 minutes of non-face-to-face time during this encounter.  This includes time for charting and coordination of care   Naomi MARLA Chad, MD

## 2024-10-22 NOTE — Assessment & Plan Note (Signed)
 05/26/2019: Left mastectomy Victoria Richardson): two malignant tumors, First tumor: IDC grade 2, 2.2cm, and IDC grade 3, 0.5cm, clear margins, 8 lymph nodes negative for carcinoma, ER 100%, PR 0%, Ki-67 5%; second tumor: Grade 3 IDC 0.5 cm, margins negative, 0/2 lymph nodes, ER 100%, PR 5%, Ki-67 30%, HER-2 -1+   Treatment plan: 1. Oncotype DX score: 20: ROR: 6%  2. Adjuvant antiestrogen therapy with anastrozole  1 mg daily x5 to 7 years started Oct 2020   Her mom Laray Cumming is also a patient of mine and breast cancer survivor  Genetic testing: Negative   Anastrozole  Toxicities: Occasional hot flashes   Joint stiffness especially in the hands: Improved with CBD oil Osteoporosis: On Fosamax    Breast Cancer Surveillance: 1. Mammograms 01/02/2023: Right mammogram, Density Cat B. 2. Breast Exam: 01/31/2023: Benign, stiffness in the left chest wall area  Chest wall pain: CT chest 10/15/2024: No metastatic disease identified in the chest.  Thyroid nodules, stable lung nodules, emphysema

## 2025-02-02 ENCOUNTER — Ambulatory Visit: Admitting: Hematology and Oncology
